# Patient Record
Sex: Female | Born: 1935 | Race: White | Hispanic: No | Marital: Married | Smoking: Never smoker
Health system: Southern US, Community
[De-identification: ages and names within clinical notes are randomized; demographics above are authoritative.]

## PROBLEM LIST (undated history)

## (undated) DIAGNOSIS — I2510 Atherosclerotic heart disease of native coronary artery without angina pectoris: Secondary | ICD-10-CM

## (undated) DIAGNOSIS — E7800 Pure hypercholesterolemia, unspecified: Secondary | ICD-10-CM

## (undated) DIAGNOSIS — I739 Peripheral vascular disease, unspecified: Secondary | ICD-10-CM

## (undated) DIAGNOSIS — R0600 Dyspnea, unspecified: Secondary | ICD-10-CM

## (undated) DIAGNOSIS — H9190 Unspecified hearing loss, unspecified ear: Secondary | ICD-10-CM

## (undated) DIAGNOSIS — I10 Essential (primary) hypertension: Secondary | ICD-10-CM

## (undated) HISTORY — PX: FOOT SURGERY: SHX648

## (undated) HISTORY — PX: CORONARY ANGIOPLASTY: SHX604

## (undated) HISTORY — PX: OTHER SURGICAL HISTORY: SHX169

---

## 2004-09-22 ENCOUNTER — Inpatient Hospital Stay: Payer: Self-pay | Admitting: Cardiology

## 2004-09-22 ENCOUNTER — Other Ambulatory Visit: Payer: Self-pay

## 2005-03-08 ENCOUNTER — Ambulatory Visit: Payer: Self-pay | Admitting: Internal Medicine

## 2006-05-02 ENCOUNTER — Ambulatory Visit: Payer: Self-pay | Admitting: Internal Medicine

## 2007-07-04 ENCOUNTER — Ambulatory Visit: Payer: Self-pay | Admitting: Internal Medicine

## 2008-07-01 ENCOUNTER — Ambulatory Visit: Payer: Self-pay | Admitting: Internal Medicine

## 2008-07-02 ENCOUNTER — Observation Stay: Payer: Self-pay | Admitting: Internal Medicine

## 2008-08-27 ENCOUNTER — Ambulatory Visit: Payer: Self-pay | Admitting: Internal Medicine

## 2009-08-30 ENCOUNTER — Ambulatory Visit: Payer: Self-pay | Admitting: Internal Medicine

## 2010-09-01 ENCOUNTER — Ambulatory Visit: Payer: Self-pay | Admitting: Internal Medicine

## 2011-09-05 ENCOUNTER — Ambulatory Visit: Payer: Self-pay | Admitting: Internal Medicine

## 2012-09-10 ENCOUNTER — Ambulatory Visit: Payer: Self-pay | Admitting: Internal Medicine

## 2013-11-18 ENCOUNTER — Ambulatory Visit: Payer: Self-pay | Admitting: Internal Medicine

## 2015-03-22 ENCOUNTER — Other Ambulatory Visit: Payer: Self-pay | Admitting: Internal Medicine

## 2015-03-22 DIAGNOSIS — Z1231 Encounter for screening mammogram for malignant neoplasm of breast: Secondary | ICD-10-CM

## 2015-04-07 ENCOUNTER — Ambulatory Visit
Admission: RE | Admit: 2015-04-07 | Discharge: 2015-04-07 | Disposition: A | Discharge status: Home / Self Care | Payer: PPO | Source: Ambulatory Visit | Attending: Internal Medicine | Admitting: Internal Medicine

## 2015-04-07 DIAGNOSIS — Z1231 Encounter for screening mammogram for malignant neoplasm of breast: Secondary | ICD-10-CM | POA: Insufficient documentation

## 2015-11-10 ENCOUNTER — Encounter: Payer: Self-pay | Admitting: *Deleted

## 2015-11-16 ENCOUNTER — Ambulatory Visit: Payer: PPO | Admitting: Anesthesiology

## 2015-11-16 ENCOUNTER — Encounter: Payer: Self-pay | Admitting: *Deleted

## 2015-11-16 ENCOUNTER — Ambulatory Visit
Admission: RE | Admit: 2015-11-16 | Discharge: 2015-11-16 | Disposition: A | Discharge status: Home / Self Care | Payer: PPO | Source: Ambulatory Visit | Attending: Ophthalmology | Admitting: Ophthalmology

## 2015-11-16 ENCOUNTER — Encounter
Admission: RE | Disposition: A | Discharge status: Home / Self Care | Payer: Self-pay | Source: Ambulatory Visit | Attending: Ophthalmology

## 2015-11-16 DIAGNOSIS — H2511 Age-related nuclear cataract, right eye: Secondary | ICD-10-CM | POA: Diagnosis present

## 2015-11-16 DIAGNOSIS — E7800 Pure hypercholesterolemia, unspecified: Secondary | ICD-10-CM | POA: Insufficient documentation

## 2015-11-16 DIAGNOSIS — Z955 Presence of coronary angioplasty implant and graft: Secondary | ICD-10-CM | POA: Diagnosis not present

## 2015-11-16 DIAGNOSIS — H9190 Unspecified hearing loss, unspecified ear: Secondary | ICD-10-CM | POA: Diagnosis not present

## 2015-11-16 DIAGNOSIS — I10 Essential (primary) hypertension: Secondary | ICD-10-CM | POA: Diagnosis not present

## 2015-11-16 DIAGNOSIS — I2510 Atherosclerotic heart disease of native coronary artery without angina pectoris: Secondary | ICD-10-CM | POA: Diagnosis not present

## 2015-11-16 HISTORY — PX: CATARACT EXTRACTION W/PHACO: SHX586

## 2015-11-16 HISTORY — DX: Atherosclerotic heart disease of native coronary artery without angina pectoris: I25.10

## 2015-11-16 HISTORY — DX: Essential (primary) hypertension: I10

## 2015-11-16 HISTORY — DX: Unspecified hearing loss, unspecified ear: H91.90

## 2015-11-16 SURGERY — PHACOEMULSIFICATION, CATARACT, WITH IOL INSERTION
Anesthesia: Monitor Anesthesia Care | Site: Eye | Laterality: Right | Wound class: Clean

## 2015-11-16 MED ORDER — EPINEPHRINE HCL 1 MG/ML IJ SOLN
INTRAMUSCULAR | Status: DC | PRN
  Administered 2015-11-16: 09:00:00 via OPHTHALMIC

## 2015-11-16 MED ORDER — EPINEPHRINE HCL 1 MG/ML IJ SOLN
INTRAMUSCULAR | Status: AC
  Filled 2015-11-16: qty 1

## 2015-11-16 MED ORDER — SODIUM CHLORIDE 0.9 % IV SOLN
INTRAVENOUS | Status: DC
  Administered 2015-11-16 (×2): via INTRAVENOUS

## 2015-11-16 MED ORDER — CEFUROXIME OPHTHALMIC INJECTION 1 MG/0.1 ML
INJECTION | OPHTHALMIC | Status: AC
  Filled 2015-11-16: qty 0.1

## 2015-11-16 MED ORDER — CEFUROXIME OPHTHALMIC INJECTION 1 MG/0.1 ML
INJECTION | OPHTHALMIC | Status: DC | PRN
  Administered 2015-11-16: 0.1 mL via INTRACAMERAL

## 2015-11-16 MED ORDER — MIDAZOLAM HCL 2 MG/2ML IJ SOLN
INTRAMUSCULAR | Status: DC | PRN
  Administered 2015-11-16: 1 mg via INTRAVENOUS

## 2015-11-16 MED ORDER — MOXIFLOXACIN HCL 0.5 % OP SOLN: 1.0000 [drp] | OPHTHALMIC | Status: DC | PRN

## 2015-11-16 MED ORDER — ARMC OPHTHALMIC DILATING GEL
1.0000 "application " | OPHTHALMIC | Status: DC | PRN
  Administered 2015-11-16: 1 via OPHTHALMIC

## 2015-11-16 MED ORDER — MOXIFLOXACIN HCL 0.5 % OP SOLN
OPHTHALMIC | Status: DC | PRN
  Administered 2015-11-16: 1 [drp] via OPHTHALMIC

## 2015-11-16 MED ORDER — TETRACAINE HCL 0.5 % OP SOLN
1.0000 [drp] | Freq: Once | OPHTHALMIC | Status: AC
  Administered 2015-11-16: 1 [drp] via OPHTHALMIC

## 2015-11-16 MED ORDER — NA CHONDROIT SULF-NA HYALURON 40-17 MG/ML IO SOLN
INTRAOCULAR | Status: DC | PRN
  Administered 2015-11-16: 1 mL via INTRAOCULAR

## 2015-11-16 MED ORDER — NA CHONDROIT SULF-NA HYALURON 40-17 MG/ML IO SOLN
INTRAOCULAR | Status: AC
  Filled 2015-11-16: qty 1

## 2015-11-16 MED ORDER — CARBACHOL 0.01 % IO SOLN
INTRAOCULAR | Status: DC | PRN
  Administered 2015-11-16: 0.5 mL via INTRAOCULAR

## 2015-11-16 MED ORDER — POVIDONE-IODINE 5 % OP SOLN
1.0000 "application " | Freq: Once | OPHTHALMIC | Status: AC
  Administered 2015-11-16: 1 via OPHTHALMIC

## 2015-11-16 SURGICAL SUPPLY — 21 items
CANNULA ANT/CHMB 27GA (MISCELLANEOUS) ×3 IMPLANT
CUP MEDICINE 2OZ PLAST GRAD ST (MISCELLANEOUS) ×3 IMPLANT
GLOVE BIO SURGEON STRL SZ8 (GLOVE) ×3 IMPLANT
GLOVE BIOGEL M 6.5 STRL (GLOVE) ×3 IMPLANT
GLOVE SURG LX 8.0 MICRO (GLOVE) ×2
GLOVE SURG LX STRL 8.0 MICRO (GLOVE) ×1 IMPLANT
GOWN STRL REUS W/ TWL LRG LVL3 (GOWN DISPOSABLE) ×2 IMPLANT
GOWN STRL REUS W/TWL LRG LVL3 (GOWN DISPOSABLE) ×4
LENS IOL TECNIS ITEC 24.0 (Intraocular Lens) ×3 IMPLANT
PACK CATARACT (MISCELLANEOUS) ×3 IMPLANT
PACK CATARACT BRASINGTON LX (MISCELLANEOUS) ×3 IMPLANT
PACK EYE AFTER SURG (MISCELLANEOUS) ×3 IMPLANT
SOL BSS BAG (MISCELLANEOUS) ×3
SOL PREP PVP 2OZ (MISCELLANEOUS) ×3
SOLUTION BSS BAG (MISCELLANEOUS) ×1 IMPLANT
SOLUTION PREP PVP 2OZ (MISCELLANEOUS) ×1 IMPLANT
SYR 3ML LL SCALE MARK (SYRINGE) ×3 IMPLANT
SYR 5ML LL (SYRINGE) ×3 IMPLANT
SYR TB 1ML 27GX1/2 LL (SYRINGE) ×3 IMPLANT
WATER STERILE IRR 1000ML POUR (IV SOLUTION) ×3 IMPLANT
WIPE NON LINTING 3.25X3.25 (MISCELLANEOUS) ×3 IMPLANT

## 2015-11-16 NOTE — Discharge Instructions (Signed)
Eye Surgery Discharge Instructions  Expect mild scratchy sensation or mild soreness. DO NOT RUB YOUR EYE!  The day of surgery:  Minimal physical activity, but bed rest is not required  No reading, computer work, or close hand work  No bending, lifting, or straining.  May watch TV  For 24 hours:  No driving, legal decisions, or alcoholic beverages  Safety precautions  Eat anything you prefer: It is better to start with liquids, then soup then solid foods.  _____ Eye patch should be worn until postoperative exam tomorrow.  ____ Solar shield eyeglasses should be worn for comfort in the sunlight/patch while sleeping  Resume all regular medications including aspirin or Coumadin if these were discontinued prior to surgery. You may shower, bathe, shave, or wash your hair. Tylenol may be taken for mild discomfort.  Call your doctor if you experience significant pain, nausea, or vomiting, fever > 101 or other signs of infection. [phone] or [phone] Specific instructions:  Follow-up Information    Follow up with Carlena BjornstadPORFILIO,WILLIAM LOUIS, MD On 11/17/2015.   Specialty:  Ophthalmology   Why:  11:00   Contact information:   [address] [place] [address] [phone]

## 2015-11-16 NOTE — Anesthesia Preprocedure Evaluation (Signed)
Anesthesia Evaluation  Patient identified by MRN, date of birth, ID band Patient awake    Reviewed: Allergy & Precautions, NPO status , Patient's Chart, lab work & pertinent test results, reviewed documented beta blocker date and time   Airway Mallampati: II  TM Distance: >3 FB     Dental  (+) Upper Dentures, Partial Lower   Pulmonary neg pulmonary ROS,    Pulmonary exam normal        Cardiovascular hypertension, Pt. on medications and Pt. on home beta blockers + CAD  Normal cardiovascular exam     Neuro/Psych Hard of Hearing negative psych ROS   GI/Hepatic negative GI ROS, Neg liver ROS,   Endo/Other  negative endocrine ROS  Renal/GU negative Renal ROS  negative genitourinary   Musculoskeletal negative musculoskeletal ROS (+)   Abdominal Normal abdominal exam  (+)   Peds negative pediatric ROS (+)  Hematology negative hematology ROS (+)   Anesthesia Other Findings   Reproductive/Obstetrics                             Anesthesia Physical Anesthesia Plan  ASA: III  Anesthesia Plan: MAC   Post-op Pain Management:    Induction: Intravenous  Airway Management Planned: Nasal Cannula  Additional Equipment:   Intra-op Plan:   Post-operative Plan:   Informed Consent: I have reviewed the patients History and Physical, chart, labs and discussed the procedure including the risks, benefits and alternatives for the proposed anesthesia with the patient or authorized representative who has indicated his/her understanding and acceptance.   Dental advisory given  Plan Discussed with: CRNA and Surgeon  Anesthesia Plan Comments:         Anesthesia Quick Evaluation

## 2015-11-16 NOTE — Transfer of Care (Signed)
Immediate Anesthesia Transfer of Care Note  Patient: Jeanette PaganiniDorothy B Malone  Procedure(s) Performed: Procedure(s) with comments: CATARACT EXTRACTION PHACO AND INTRAOCULAR LENS PLACEMENT (IOC) (Right) - US 48.9 AP% 16.7 CDE 8.13 Fluid Pack Lot # [id number]  Patient Location: PACU  Anesthesia Type:MAC  Level of Consciousness: awake, alert  and oriented  Airway & Oxygen Therapy: Patient Spontanous Breathing  Post-op Assessment: Report given to RN  Post vital signs: Reviewed and stable  Last Vitals:  Filed Vitals:   11/16/15 0818  BP: 139/86  Pulse: 66  Temp: 36.7 C  Resp: 18    Last Pain: There were no vitals filed for this visit.       Complications: No apparent anesthesia complications

## 2015-11-16 NOTE — H&P (Signed)
  All labs reviewed. Abnormal studies sent to patients PCP when indicated.  Previous H&P reviewed, patient examined, there are NO CHANGES.  Jeanette Rodwell LOUIS7/11/20178:59 AM

## 2015-11-16 NOTE — Op Note (Signed)
PREOPERATIVE DIAGNOSIS:  Nuclear sclerotic cataract of the right eye.   POSTOPERATIVE DIAGNOSIS: NUCLEAR SCLEROTIC CATARACT RIGHT EYE   OPERATIVE PROCEDURE:  Procedure(s): CATARACT EXTRACTION PHACO AND INTRAOCULAR LENS PLACEMENT (IOC)   SURGEON:  Galen ManilaWilliam Malikai Gut, MD.   ANESTHESIA:  Anesthesiologist: Yves DillPaul Carroll, MD CRNA: Evelena Peateborah Ferrero-Conover, CRNA  1.      Managed anesthesia care. 2.      Topical tetracaine drops followed by 2% Xylocaine jelly applied in the preoperative holding area.   COMPLICATIONS:  None.   TECHNIQUE:   Stop and chop   DESCRIPTION OF PROCEDURE:  The patient was examined and consented in the preoperative holding area where the aforementioned topical anesthesia was applied to the right eye and then brought back to the Operating Room where the right eye was prepped and draped in the usual sterile ophthalmic fashion and a lid speculum was placed. A paracentesis was created with the side port blade and the anterior chamber was filled with viscoelastic. A near clear corneal incision was performed with the steel keratome. A continuous curvilinear capsulorrhexis was performed with a cystotome followed by the capsulorrhexis forceps. Hydrodissection and hydrodelineation were carried out with BSS on a blunt cannula. The lens was removed in a stop and chop  technique and the remaining cortical material was removed with the irrigation-aspiration handpiece. The capsular bag was inflated with viscoelastic and the Technis ZCB00  lens was placed in the capsular bag without complication. The remaining viscoelastic was removed from the eye with the irrigation-aspiration handpiece. The wounds were hydrated. The anterior chamber was flushed with Miostat and the eye was inflated to physiologic pressure. 0.1 mL of cefuroxime concentration 10 mg/mL was placed in the anterior chamber. The wounds were found to be water tight. The eye was dressed with Vigamox. The patient was given protective  glasses to wear throughout the day and a shield with which to sleep tonight. The patient was also given drops with which to begin a drop regimen today and will follow-up with me in one day.  Implant Name Type Inv. Item Serial No. Manufacturer Lot No. LRB No. Used  LENS IOL DIOP 24.0 - [id number] Intraocular Lens LENS IOL DIOP 24.0 [id number] AMO   Right 1   Procedure(s) with comments: CATARACT EXTRACTION PHACO AND INTRAOCULAR LENS PLACEMENT (IOC) (Right) - US 48.9 AP% 16.7 CDE 8.13 Fluid Pack Lot # [id number]  Electronically signed: Eyleen Rawlinson LOUIS 11/16/2015 9:26 AM

## 2015-11-17 NOTE — Anesthesia Postprocedure Evaluation (Signed)
Anesthesia Post Note  Patient: Jeanette Malone  Procedure(s) Performed: Procedure(s) (LRB): CATARACT EXTRACTION PHACO AND INTRAOCULAR LENS PLACEMENT (IOC) (Right)  Patient location during evaluation: PACU Anesthesia Type: MAC Level of consciousness: awake and alert and oriented Pain management: pain level controlled Vital Signs Assessment: post-procedure vital signs reviewed and stable Respiratory status: spontaneous breathing Cardiovascular status: blood pressure returned to baseline Anesthetic complications: no    Last Vitals:  Filed Vitals:   11/16/15 0929 11/16/15 0946  BP: 151/65 166/79  Pulse: 59 64  Temp: 36.6 C   Resp: 16 16    Last Pain:  Filed Vitals:   11/17/15 0817  PainSc: 0-No pain                 Bear Osten

## 2016-01-18 ENCOUNTER — Encounter: Payer: Self-pay | Admitting: *Deleted

## 2016-01-18 ENCOUNTER — Ambulatory Visit: Payer: PPO | Admitting: Anesthesiology

## 2016-01-18 ENCOUNTER — Ambulatory Visit
Admission: RE | Admit: 2016-01-18 | Discharge: 2016-01-18 | Disposition: A | Discharge status: Home / Self Care | Payer: PPO | Source: Ambulatory Visit | Attending: Ophthalmology | Admitting: Ophthalmology

## 2016-01-18 ENCOUNTER — Encounter
Admission: RE | Disposition: A | Discharge status: Home / Self Care | Payer: Self-pay | Source: Ambulatory Visit | Attending: Ophthalmology

## 2016-01-18 DIAGNOSIS — Z882 Allergy status to sulfonamides status: Secondary | ICD-10-CM | POA: Diagnosis not present

## 2016-01-18 DIAGNOSIS — E7800 Pure hypercholesterolemia, unspecified: Secondary | ICD-10-CM | POA: Insufficient documentation

## 2016-01-18 DIAGNOSIS — Z888 Allergy status to other drugs, medicaments and biological substances status: Secondary | ICD-10-CM | POA: Insufficient documentation

## 2016-01-18 DIAGNOSIS — Z885 Allergy status to narcotic agent status: Secondary | ICD-10-CM | POA: Diagnosis not present

## 2016-01-18 DIAGNOSIS — H2512 Age-related nuclear cataract, left eye: Secondary | ICD-10-CM | POA: Insufficient documentation

## 2016-01-18 DIAGNOSIS — H9190 Unspecified hearing loss, unspecified ear: Secondary | ICD-10-CM | POA: Diagnosis not present

## 2016-01-18 DIAGNOSIS — Z955 Presence of coronary angioplasty implant and graft: Secondary | ICD-10-CM | POA: Insufficient documentation

## 2016-01-18 DIAGNOSIS — I10 Essential (primary) hypertension: Secondary | ICD-10-CM | POA: Insufficient documentation

## 2016-01-18 HISTORY — PX: CATARACT EXTRACTION W/PHACO: SHX586

## 2016-01-18 SURGERY — PHACOEMULSIFICATION, CATARACT, WITH IOL INSERTION
Anesthesia: Monitor Anesthesia Care | Site: Eye | Laterality: Left | Wound class: Clean

## 2016-01-18 MED ORDER — EPINEPHRINE HCL 1 MG/ML IJ SOLN
INTRAOCULAR | Status: DC | PRN
  Administered 2016-01-18: 1 mL via OPHTHALMIC

## 2016-01-18 MED ORDER — ARMC OPHTHALMIC DILATING GEL
OPHTHALMIC | Status: AC
  Administered 2016-01-18: 07:00:00
  Filled 2016-01-18: qty 0.25

## 2016-01-18 MED ORDER — MOXIFLOXACIN HCL 0.5 % OP SOLN: 1.0000 [drp] | Freq: Once | OPHTHALMIC | Status: DC

## 2016-01-18 MED ORDER — MIDAZOLAM HCL 2 MG/2ML IJ SOLN
INTRAMUSCULAR | Status: DC | PRN
  Administered 2016-01-18 (×2): 0.5 mg via INTRAVENOUS

## 2016-01-18 MED ORDER — TETRACAINE HCL 0.5 % OP SOLN
OPHTHALMIC | Status: AC
  Filled 2016-01-18: qty 2

## 2016-01-18 MED ORDER — MOXIFLOXACIN HCL 0.5 % OP SOLN
OPHTHALMIC | Status: DC | PRN
  Administered 2016-01-18: 1 [drp] via OPHTHALMIC

## 2016-01-18 MED ORDER — CEFUROXIME OPHTHALMIC INJECTION 1 MG/0.1 ML
INJECTION | OPHTHALMIC | Status: DC | PRN
  Administered 2016-01-18: .1 mL via INTRACAMERAL

## 2016-01-18 MED ORDER — EPINEPHRINE HCL 1 MG/ML IJ SOLN
INTRAMUSCULAR | Status: AC
  Filled 2016-01-18: qty 1

## 2016-01-18 MED ORDER — CEFUROXIME OPHTHALMIC INJECTION 1 MG/0.1 ML
INJECTION | OPHTHALMIC | Status: AC
  Filled 2016-01-18: qty 0.1

## 2016-01-18 MED ORDER — MOXIFLOXACIN HCL 0.5 % OP SOLN
OPHTHALMIC | Status: AC
  Filled 2016-01-18: qty 3

## 2016-01-18 MED ORDER — NA CHONDROIT SULF-NA HYALURON 40-17 MG/ML IO SOLN
INTRAOCULAR | Status: DC | PRN
  Administered 2016-01-18: 1 mL via INTRAOCULAR

## 2016-01-18 MED ORDER — CARBACHOL 0.01 % IO SOLN
INTRAOCULAR | Status: DC | PRN
  Administered 2016-01-18: .5 mL via INTRAOCULAR

## 2016-01-18 MED ORDER — POVIDONE-IODINE 5 % OP SOLN
1.0000 "application " | Freq: Once | OPHTHALMIC | Status: AC
  Administered 2016-01-18: 1 via OPHTHALMIC

## 2016-01-18 MED ORDER — NA CHONDROIT SULF-NA HYALURON 40-17 MG/ML IO SOLN
INTRAOCULAR | Status: AC
  Filled 2016-01-18: qty 1

## 2016-01-18 MED ORDER — TETRACAINE HCL 0.5 % OP SOLN
1.0000 [drp] | Freq: Once | OPHTHALMIC | Status: AC
  Administered 2016-01-18: 1 [drp] via OPHTHALMIC

## 2016-01-18 MED ORDER — ARMC OPHTHALMIC DILATING GEL
1.0000 "application " | OPHTHALMIC | Status: AC
  Administered 2016-01-18: 1 via OPHTHALMIC

## 2016-01-18 MED ORDER — SODIUM CHLORIDE 0.9 % IV SOLN
INTRAVENOUS | Status: DC
  Administered 2016-01-18: 07:00:00 via INTRAVENOUS

## 2016-01-18 MED ORDER — POVIDONE-IODINE 5 % OP SOLN
OPHTHALMIC | Status: AC
  Filled 2016-01-18: qty 30

## 2016-01-18 SURGICAL SUPPLY — 21 items
CANNULA ANT/CHMB 27GA (MISCELLANEOUS) ×3 IMPLANT
CUP MEDICINE 2OZ PLAST GRAD ST (MISCELLANEOUS) ×3 IMPLANT
GLOVE BIO SURGEON STRL SZ8 (GLOVE) ×3 IMPLANT
GLOVE BIOGEL M 6.5 STRL (GLOVE) ×3 IMPLANT
GLOVE SURG LX 8.0 MICRO (GLOVE) ×2
GLOVE SURG LX STRL 8.0 MICRO (GLOVE) ×1 IMPLANT
GOWN STRL REUS W/ TWL LRG LVL3 (GOWN DISPOSABLE) ×2 IMPLANT
GOWN STRL REUS W/TWL LRG LVL3 (GOWN DISPOSABLE) ×4
LENS IOL TECNIS ITEC 23.5 (Intraocular Lens) ×3 IMPLANT
PACK CATARACT (MISCELLANEOUS) ×3 IMPLANT
PACK CATARACT BRASINGTON LX (MISCELLANEOUS) ×3 IMPLANT
PACK EYE AFTER SURG (MISCELLANEOUS) ×3 IMPLANT
SOL BSS BAG (MISCELLANEOUS) ×3
SOL PREP PVP 2OZ (MISCELLANEOUS) ×3
SOLUTION BSS BAG (MISCELLANEOUS) ×1 IMPLANT
SOLUTION PREP PVP 2OZ (MISCELLANEOUS) ×1 IMPLANT
SYR 3ML LL SCALE MARK (SYRINGE) ×3 IMPLANT
SYR 5ML LL (SYRINGE) ×3 IMPLANT
SYR TB 1ML 27GX1/2 LL (SYRINGE) ×3 IMPLANT
WATER STERILE IRR 250ML POUR (IV SOLUTION) ×3 IMPLANT
WIPE NON LINTING 3.25X3.25 (MISCELLANEOUS) ×3 IMPLANT

## 2016-01-18 NOTE — Anesthesia Preprocedure Evaluation (Signed)
Anesthesia Evaluation  Patient identified by MRN, date of birth, ID band Patient awake    Reviewed: Allergy & Precautions, NPO status , Patient's Chart, lab work & pertinent test results, reviewed documented beta blocker date and time   History of Anesthesia Complications Negative for: history of anesthetic complications  Airway Mallampati: II  TM Distance: >3 FB Neck ROM: limited    Dental  (+) Upper Dentures, Partial Lower   Pulmonary neg pulmonary ROS,    Pulmonary exam normal        Cardiovascular hypertension, Pt. on medications and Pt. on home beta blockers + CAD  Normal cardiovascular exam     Neuro/Psych Hard of Hearing negative psych ROS   GI/Hepatic negative GI ROS, Neg liver ROS,   Endo/Other  negative endocrine ROS  Renal/GU negative Renal ROS  negative genitourinary   Musculoskeletal negative musculoskeletal ROS (+)   Abdominal Normal abdominal exam  (+)   Peds negative pediatric ROS (+)  Hematology negative hematology ROS (+)   Anesthesia Other Findings Past Medical History: No date: Coronary artery disease No date: HOH (hard of hearing)     Comment: AIDS No date: Hypertension   Reproductive/Obstetrics                             Anesthesia Physical  Anesthesia Plan  ASA: III  Anesthesia Plan: MAC   Post-op Pain Management:    Induction: Intravenous  Airway Management Planned: Nasal Cannula  Additional Equipment:   Intra-op Plan:   Post-operative Plan:   Informed Consent: I have reviewed the patients History and Physical, chart, labs and discussed the procedure including the risks, benefits and alternatives for the proposed anesthesia with the patient or authorized representative who has indicated his/her understanding and acceptance.   Dental advisory given  Plan Discussed with: CRNA and Surgeon  Anesthesia Plan Comments:         Anesthesia  Quick Evaluation

## 2016-01-18 NOTE — Discharge Instructions (Signed)
Eye Surgery Discharge Instructions  Expect mild scratchy sensation or mild soreness. DO NOT RUB YOUR EYE!  The day of surgery:  Minimal physical activity, but bed rest is not required  No reading, computer work, or close hand work  No bending, lifting, or straining.  May watch TV  For 24 hours:  No driving, legal decisions, or alcoholic beverages  Safety precautions  Eat anything you prefer: It is better to start with liquids, then soup then solid foods.  _____ Eye patch should be worn until postoperative exam tomorrow.  ____ Solar shield eyeglasses should be worn for comfort in the sunlight/patch while sleeping  Resume all regular medications including aspirin or Coumadin if these were discontinued prior to surgery. You may shower, bathe, shave, or wash your hair. Tylenol may be taken for mild discomfort.  Call your doctor if you experience significant pain, nausea, or vomiting, fever > 101 or other signs of infection. [phone] or [phone] Specific instructions:  Follow-up Information    PORFILIO,WILLIAM LOUIS, MD Follow up on 01/19/2016.   Specialty:  Ophthalmology Why:  10:50 am Contact information: [address] [place] [address] [phone]

## 2016-01-18 NOTE — Op Note (Signed)
PREOPERATIVE DIAGNOSIS:  Nuclear sclerotic cataract of the left eye.   POSTOPERATIVE DIAGNOSIS:  nuclear sclerotic cataract left eye   OPERATIVE PROCEDURE:  Procedure(s): CATARACT EXTRACTION PHACO AND INTRAOCULAR LENS PLACEMENT (IOC)   SURGEON:  Galen ManilaWilliam Derek Huneycutt, MD.   ANESTHESIA:   Anesthesiologist: Rosaria FerriesJoseph K Piscitello, MD CRNA: Ginger CarneStephanie Michelet, CRNA  1.      Managed anesthesia care. 2.      Topical tetracaine drops followed by 2% Xylocaine jelly applied in the preoperative holding area.   COMPLICATIONS:  None.   TECHNIQUE:   Stop and chop   DESCRIPTION OF PROCEDURE:  The patient was examined and consented in the preoperative holding area where the aforementioned topical anesthesia was applied to the left eye and then brought back to the Operating Room where the left eye was prepped and draped in the usual sterile ophthalmic fashion and a lid speculum was placed. A paracentesis was created with the side port blade and the anterior chamber was filled with viscoelastic. A near clear corneal incision was performed with the steel keratome. A continuous curvilinear capsulorrhexis was performed with a cystotome followed by the capsulorrhexis forceps. Hydrodissection and hydrodelineation were carried out with BSS on a blunt cannula. The lens was removed in a stop and chop  technique and the remaining cortical material was removed with the irrigation-aspiration handpiece. The capsular bag was inflated with viscoelastic and the Technis ZCB00 lens was placed in the capsular bag without complication. The remaining viscoelastic was removed from the eye with the irrigation-aspiration handpiece. The wounds were hydrated. The anterior chamber was flushed with Miostat and the eye was inflated to physiologic pressure. 0.1 mL of cefuroxime concentration 10 mg/mL was placed in the anterior chamber. The wounds were found to be water tight. The eye was dressed with Vigamox. The patient was given protective  glasses to wear throughout the day and a shield with which to sleep tonight. The patient was also given drops with which to begin a drop regimen today and will follow-up with me in one day.  Implant Name Type Inv. Item Serial No. Manufacturer Lot No. LRB No. Used  LENS IOL DIOP 23.5 - [id number] Intraocular Lens LENS IOL DIOP 23.5 [id number] AMO   Left 1   Procedure(s) with comments: CATARACT EXTRACTION PHACO AND INTRAOCULAR LENS PLACEMENT (IOC) (Left) - US 00:40 AP% 17.7 CDE 7.17 fluid pack lot # [id number]  Electronically signed: Dawt Reeb LOUIS 01/18/2016 8:29 AM

## 2016-01-18 NOTE — Anesthesia Procedure Notes (Signed)
Date/Time: 01/18/2016 8:13 AM Performed by: Ginger CarneMICHELET, Joakim Huesman Pre-anesthesia Checklist: Patient identified, Emergency Drugs available, Suction available, Patient being monitored and Timeout performed Patient Re-evaluated:Patient Re-evaluated prior to inductionOxygen Delivery Method: Nasal cannula

## 2016-01-18 NOTE — H&P (Signed)
  All labs reviewed. Abnormal studies sent to patients PCP when indicated.  Previous H&P reviewed, patient examined, there are NO CHANGES.  Ademola Vert LOUIS9/12/20178:04 AM

## 2016-01-18 NOTE — Transfer of Care (Signed)
Immediate Anesthesia Transfer of Care Note  Patient: Jeanette PaganiniDorothy B Malone  Procedure(s) Performed: Procedure(s) with comments: CATARACT EXTRACTION PHACO AND INTRAOCULAR LENS PLACEMENT (IOC) (Left) - US 00:40 AP% 17.7 CDE 7.17 fluid pack lot # [id number]  Patient Location: PACU  Anesthesia Type:MAC  Level of Consciousness: awake, alert  and oriented  Airway & Oxygen Therapy: Patient Spontanous Breathing  Post-op Assessment: Report given to RN and Post -op Vital signs reviewed and stable  Post vital signs: Reviewed and stable  Last Vitals:  Vitals:   01/18/16 0656 01/18/16 0831  BP: (!) 149/85 (!) 155/71  Pulse: (!) 56 (!) 52  Resp: 16 13  Temp: 36.6 C     Last Pain:  Vitals:   01/18/16 0656  TempSrc: Oral         Complications: No apparent anesthesia complications

## 2016-01-18 NOTE — Anesthesia Postprocedure Evaluation (Signed)
Anesthesia Post Note  Patient: Jeanette Malone  Procedure(s) Performed: Procedure(s) (LRB): CATARACT EXTRACTION PHACO AND INTRAOCULAR LENS PLACEMENT (IOC) (Left)  Patient location during evaluation: PACU Anesthesia Type: MAC Level of consciousness: awake, awake and alert and oriented Pain management: pain level controlled Vital Signs Assessment: post-procedure vital signs reviewed and stable Respiratory status: spontaneous breathing, nonlabored ventilation and respiratory function stable Cardiovascular status: stable Anesthetic complications: no    Last Vitals:  Vitals:   01/18/16 0831 01/18/16 0832  BP: (!) 155/71 (!) 155/71  Pulse: (!) 52 (!) 54  Resp: 13 14  Temp:  36.6 C    Last Pain:  Vitals:   01/18/16 0832  TempSrc: Oral                 Microsofthuy [name]

## 2016-01-18 NOTE — Anesthesia Postprocedure Evaluation (Signed)
Anesthesia Post Note  Patient: Jeanette PaganiniDorothy B Malone  Procedure(s) Performed: Procedure(s) (LRB): CATARACT EXTRACTION PHACO AND INTRAOCULAR LENS PLACEMENT (IOC) (Left)  Patient location during evaluation: PACU Anesthesia Type: MAC Level of consciousness: awake and alert Pain management: pain level controlled Vital Signs Assessment: post-procedure vital signs reviewed and stable Respiratory status: spontaneous breathing, nonlabored ventilation, respiratory function stable and patient connected to nasal cannula oxygen Cardiovascular status: stable and blood pressure returned to baseline Anesthetic complications: no    Last Vitals:  Vitals:   01/18/16 0831 01/18/16 0832  BP: (!) 155/71 (!) 155/71  Pulse: (!) 52 (!) 54  Resp: 13 14  Temp:  36.6 C    Last Pain:  Vitals:   01/18/16 0832  TempSrc: Oral                 Cleda MccreedyJoseph K Spiro Ausborn

## 2017-03-26 DIAGNOSIS — I209 Angina pectoris, unspecified: Secondary | ICD-10-CM

## 2017-03-27 ENCOUNTER — Ambulatory Visit
Admission: RE | Admit: 2017-03-27 | Discharge: 2017-03-28 | Disposition: A | Discharge status: Home / Self Care | Payer: Medicare HMO | Source: Ambulatory Visit | Attending: Internal Medicine | Admitting: Internal Medicine

## 2017-03-27 ENCOUNTER — Encounter
Admission: RE | Disposition: A | Discharge status: Home / Self Care | Payer: Self-pay | Source: Ambulatory Visit | Attending: Internal Medicine

## 2017-03-27 ENCOUNTER — Other Ambulatory Visit: Payer: Self-pay

## 2017-03-27 ENCOUNTER — Encounter: Payer: Self-pay | Admitting: *Deleted

## 2017-03-27 DIAGNOSIS — I10 Essential (primary) hypertension: Secondary | ICD-10-CM | POA: Diagnosis not present

## 2017-03-27 DIAGNOSIS — Z881 Allergy status to other antibiotic agents status: Secondary | ICD-10-CM | POA: Insufficient documentation

## 2017-03-27 DIAGNOSIS — I25119 Atherosclerotic heart disease of native coronary artery with unspecified angina pectoris: Secondary | ICD-10-CM | POA: Insufficient documentation

## 2017-03-27 DIAGNOSIS — Z885 Allergy status to narcotic agent status: Secondary | ICD-10-CM | POA: Diagnosis not present

## 2017-03-27 DIAGNOSIS — E7800 Pure hypercholesterolemia, unspecified: Secondary | ICD-10-CM | POA: Insufficient documentation

## 2017-03-27 DIAGNOSIS — Z882 Allergy status to sulfonamides status: Secondary | ICD-10-CM | POA: Insufficient documentation

## 2017-03-27 DIAGNOSIS — Z79899 Other long term (current) drug therapy: Secondary | ICD-10-CM | POA: Diagnosis not present

## 2017-03-27 DIAGNOSIS — Z7982 Long term (current) use of aspirin: Secondary | ICD-10-CM | POA: Diagnosis not present

## 2017-03-27 DIAGNOSIS — R079 Chest pain, unspecified: Secondary | ICD-10-CM

## 2017-03-27 DIAGNOSIS — I209 Angina pectoris, unspecified: Secondary | ICD-10-CM

## 2017-03-27 DIAGNOSIS — Z955 Presence of coronary angioplasty implant and graft: Secondary | ICD-10-CM | POA: Insufficient documentation

## 2017-03-27 HISTORY — DX: Peripheral vascular disease, unspecified: I73.9

## 2017-03-27 HISTORY — DX: Pure hypercholesterolemia, unspecified: E78.00

## 2017-03-27 HISTORY — DX: Dyspnea, unspecified: R06.00

## 2017-03-27 HISTORY — PX: LEFT HEART CATH AND CORONARY ANGIOGRAPHY: CATH118249

## 2017-03-27 HISTORY — PX: CORONARY STENT INTERVENTION: CATH118234

## 2017-03-27 LAB — CBC
HEMATOCRIT: 37.8 % (ref 35.0–47.0)
HEMOGLOBIN: 12.9 g/dL (ref 12.0–16.0)
MCH: 31.6 pg (ref 26.0–34.0)
MCHC: 34.2 g/dL (ref 32.0–36.0)
MCV: 92.3 fL (ref 80.0–100.0)
Platelets: 133 10*3/uL — ABNORMAL LOW (ref 150–440)
RBC: 4.09 MIL/uL (ref 3.80–5.20)
RDW: 14.3 % (ref 11.5–14.5)
WBC: 3.8 10*3/uL (ref 3.6–11.0)

## 2017-03-27 LAB — CREATININE, SERUM
Creatinine, Ser: 0.65 mg/dL (ref 0.44–1.00)
GFR calc Af Amer: >60 mL/min (ref >60)
GFR calc non Af Amer: >60 mL/min (ref >60)

## 2017-03-27 SURGERY — LEFT HEART CATH AND CORONARY ANGIOGRAPHY
Anesthesia: Moderate Sedation

## 2017-03-27 MED ORDER — SODIUM CHLORIDE 0.9 % IV SOLN
INTRAVENOUS | Status: AC
  Administered 2017-03-27: 09:00:00 via INTRAVENOUS

## 2017-03-27 MED ORDER — MIDAZOLAM HCL 2 MG/2ML IJ SOLN
INTRAMUSCULAR | Status: DC | PRN
  Administered 2017-03-27: 1 mg via INTRAVENOUS

## 2017-03-27 MED ORDER — HYDRALAZINE HCL 20 MG/ML IJ SOLN: 5.0000 mg | INTRAMUSCULAR | Status: AC | PRN

## 2017-03-27 MED ORDER — SODIUM CHLORIDE 0.9 % IV SOLN: 250.0000 mL | INTRAVENOUS | Status: DC | PRN

## 2017-03-27 MED ORDER — ASPIRIN 81 MG PO CHEW
CHEWABLE_TABLET | ORAL | Status: DC | PRN
  Administered 2017-03-27: 324 mg via ORAL

## 2017-03-27 MED ORDER — FENTANYL CITRATE (PF) 100 MCG/2ML IJ SOLN
INTRAMUSCULAR | Status: AC
  Filled 2017-03-27: qty 2

## 2017-03-27 MED ORDER — METOPROLOL SUCCINATE 12.5 MG HALF TABLET
12.5000 mg | ORAL_TABLET | Freq: Every day | ORAL | Status: DC
  Administered 2017-03-28: 12.5 mg via ORAL
  Filled 2017-03-27: qty 1

## 2017-03-27 MED ORDER — ASPIRIN 81 MG PO CHEW
81.0000 mg | CHEWABLE_TABLET | Freq: Every day | ORAL | Status: DC
  Administered 2017-03-28: 81 mg via ORAL
  Filled 2017-03-27: qty 1

## 2017-03-27 MED ORDER — ACETAMINOPHEN 325 MG PO TABS
650.0000 mg | ORAL_TABLET | ORAL | Status: DC | PRN
  Administered 2017-03-27: 650 mg via ORAL
  Filled 2017-03-27: qty 2

## 2017-03-27 MED ORDER — CLOPIDOGREL BISULFATE 75 MG PO TABS
ORAL_TABLET | ORAL | Status: DC | PRN
  Administered 2017-03-27: 300 mg via ORAL

## 2017-03-27 MED ORDER — ISOSORBIDE MONONITRATE ER 30 MG PO TB24
30.0000 mg | ORAL_TABLET | Freq: Every day | ORAL | Status: DC
  Administered 2017-03-28: 30 mg via ORAL
  Filled 2017-03-27: qty 1

## 2017-03-27 MED ORDER — ONDANSETRON HCL 4 MG/2ML IJ SOLN: 4.0000 mg | Freq: Four times a day (QID) | INTRAMUSCULAR | Status: DC | PRN

## 2017-03-27 MED ORDER — BIVALIRUDIN BOLUS VIA INFUSION - CUPID
INTRAVENOUS | Status: DC | PRN
  Administered 2017-03-27: 45.225 mg via INTRAVENOUS

## 2017-03-27 MED ORDER — ASPIRIN 81 MG PO CHEW: 81.0000 mg | CHEWABLE_TABLET | ORAL | Status: DC

## 2017-03-27 MED ORDER — LABETALOL HCL 5 MG/ML IV SOLN: 10.0000 mg | INTRAVENOUS | Status: AC | PRN

## 2017-03-27 MED ORDER — SODIUM CHLORIDE 0.9% FLUSH
3.0000 mL | INTRAVENOUS | Status: DC | PRN
  Administered 2017-03-27: 3 mL via INTRAVENOUS
  Filled 2017-03-27: qty 3

## 2017-03-27 MED ORDER — IOPAMIDOL (ISOVUE-300) INJECTION 61%
INTRAVENOUS | Status: DC | PRN
  Administered 2017-03-27: 120 mL via INTRA_ARTERIAL

## 2017-03-27 MED ORDER — SODIUM CHLORIDE 0.9% FLUSH
3.0000 mL | Freq: Two times a day (BID) | INTRAVENOUS | Status: DC
  Administered 2017-03-27 – 2017-03-28 (×3): 3 mL via INTRAVENOUS

## 2017-03-27 MED ORDER — SODIUM CHLORIDE 0.9 % WEIGHT BASED INFUSION
3.0000 mL/kg/h | INTRAVENOUS | Status: DC
  Administered 2017-03-27: 3 mL/kg/h via INTRAVENOUS

## 2017-03-27 MED ORDER — ASPIRIN 81 MG PO CHEW
CHEWABLE_TABLET | ORAL | Status: AC
  Filled 2017-03-27: qty 4

## 2017-03-27 MED ORDER — FENTANYL CITRATE (PF) 100 MCG/2ML IJ SOLN
INTRAMUSCULAR | Status: DC | PRN
  Administered 2017-03-27: 25 ug via INTRAVENOUS

## 2017-03-27 MED ORDER — SODIUM CHLORIDE 0.9% FLUSH: 3.0000 mL | Freq: Two times a day (BID) | INTRAVENOUS | Status: DC

## 2017-03-27 MED ORDER — MIDAZOLAM HCL 2 MG/2ML IJ SOLN
INTRAMUSCULAR | Status: AC
  Filled 2017-03-27: qty 2

## 2017-03-27 MED ORDER — SODIUM CHLORIDE 0.9% FLUSH: 3.0000 mL | INTRAVENOUS | Status: DC | PRN

## 2017-03-27 MED ORDER — NITROGLYCERIN 5 MG/ML IV SOLN
INTRAVENOUS | Status: AC
  Filled 2017-03-27: qty 10

## 2017-03-27 MED ORDER — SODIUM CHLORIDE 0.9 % IV SOLN
INTRAVENOUS | Status: DC | PRN
  Administered 2017-03-27: 1.75 mg/kg/h via INTRAVENOUS

## 2017-03-27 MED ORDER — CLOPIDOGREL BISULFATE 75 MG PO TABS
75.0000 mg | ORAL_TABLET | Freq: Every day | ORAL | Status: DC
  Administered 2017-03-28: 75 mg via ORAL
  Filled 2017-03-27: qty 1

## 2017-03-27 MED ORDER — HEPARIN SODIUM (PORCINE) 5000 UNIT/ML IJ SOLN
5000.0000 [IU] | Freq: Three times a day (TID) | INTRAMUSCULAR | Status: DC
  Administered 2017-03-28: 5000 [IU] via SUBCUTANEOUS
  Filled 2017-03-27: qty 1

## 2017-03-27 MED ORDER — BIVALIRUDIN TRIFLUOROACETATE 250 MG IV SOLR
INTRAVENOUS | Status: AC
  Filled 2017-03-27: qty 250

## 2017-03-27 MED ORDER — CLOPIDOGREL BISULFATE 75 MG PO TABS
ORAL_TABLET | ORAL | Status: AC
  Filled 2017-03-27: qty 8

## 2017-03-27 MED ORDER — LIDOCAINE HCL (PF) 1 % IJ SOLN
INTRAMUSCULAR | Status: AC
  Filled 2017-03-27: qty 30

## 2017-03-27 MED ORDER — PANTOPRAZOLE SODIUM 40 MG PO TBEC
40.0000 mg | DELAYED_RELEASE_TABLET | Freq: Every day | ORAL | Status: DC
  Administered 2017-03-28: 40 mg via ORAL
  Filled 2017-03-27: qty 1

## 2017-03-27 MED ORDER — IOPAMIDOL (ISOVUE-300) INJECTION 61%
INTRAVENOUS | Status: DC | PRN
  Administered 2017-03-27: 45 mL via INTRA_ARTERIAL

## 2017-03-27 MED ORDER — NITROGLYCERIN 1 MG/10 ML FOR IR/CATH LAB
INTRA_ARTERIAL | Status: DC | PRN
  Administered 2017-03-27 (×3): 200 ug via INTRACORONARY

## 2017-03-27 MED ORDER — SODIUM CHLORIDE 0.9 % WEIGHT BASED INFUSION: 1.0000 mL/kg/h | INTRAVENOUS | Status: DC

## 2017-03-27 MED ORDER — NON FORMULARY: 40.0000 mg | Freq: Every day | Status: DC

## 2017-03-27 SURGICAL SUPPLY — 19 items
BALLN TREK RX 2.25X12 (BALLOONS) ×3
BALLN ~~LOC~~ TREK RX 2.5X12 (BALLOONS) ×3
BALLOON TREK RX 2.25X12 (BALLOONS) ×1 IMPLANT
BALLOON ~~LOC~~ TREK RX 2.5X12 (BALLOONS) ×1 IMPLANT
CATH 5FR PIGTAIL DIAGNOSTIC (CATHETERS) ×3 IMPLANT
CATH INFINITI 5 FR 3DRC (CATHETERS) ×3 IMPLANT
CATH INFINITI 5FR JL4 (CATHETERS) ×3 IMPLANT
CATH INFINITI JR4 5F (CATHETERS) ×3 IMPLANT
CATH VISTA GUIDE 6FR JR4 (CATHETERS) ×3 IMPLANT
DEVICE CLOSURE MYNXGRIP 6/7F (Vascular Products) ×3 IMPLANT
DEVICE INFLAT 30 PLUS (MISCELLANEOUS) ×3 IMPLANT
KIT MANI 3VAL PERCEP (MISCELLANEOUS) ×3 IMPLANT
NEEDLE PERC 18GX7CM (NEEDLE) ×3 IMPLANT
PACK CARDIAC CATH (CUSTOM PROCEDURE TRAY) ×3 IMPLANT
SHEATH AVANTI 6FR X 11CM (SHEATH) ×3 IMPLANT
SHEATH PINNACLE 5F 10CM (SHEATH) ×3 IMPLANT
STENT SIERRA 2.50 X 18 MM (Permanent Stent) ×3 IMPLANT
WIRE EMERALD 3MM-J .035X150CM (WIRE) ×3 IMPLANT
WIRE RUNTHROUGH .014X180CM (WIRE) ×3 IMPLANT

## 2017-03-27 NOTE — Progress Notes (Signed)
Patient admitted to unit. Oriented to room, call bell, and staff. Bed in lowest position. Fall safety plan reviewed. Full assessment to Epic. Skin assessment verified with Rodney Boozeasha RN. Telemetry box verification with tele clerk and Mykia NT- Box#: [id number]. PCI today, site stable (level 0). Will continue to monitor.

## 2017-03-27 NOTE — Brief Op Note (Signed)
BRIEF CARDIAC CATHETERIZATION NOTE  DATE: 03/27/2017 TIME: 9:08 AM  PATIENT:  Jeanette Malone  82 y.o. female  PRE-OPERATIVE DIAGNOSIS:  Angina pectoris  POST-OPERATIVE DIAGNOSIS:  Same  PROCEDURE:  Procedure(s): LEFT HEART CATH AND CORONARY ANGIOGRAPHY (N/A) CORONARY STENT INTERVENTION (N/A) CORONARY STENT INTERVENTION (N/A)  SURGEON:  Surgeon(s) and Role: Panel 1:    * Lamar BlinksKowalski, Bruce J, MD - Primary Panel 2:    * Mimi Debellis, Cristal Deerhristopher, MD - Primary  FINDINGS: 1. See Dr. Philemon KingdomKowalski's diagnostic catheterization report. 2. Successful PCI to 99% mid RCA lesion with placement of Xience Sierra 2.5 x 18 mm drug-eluting stent with 0% residual stenosis and TIMI-3 flow.  RECOMMENDATIONS: 1. Dual antiplatelet therapy with aspirin and clopidogrel for at least 6 months. 2. Overnight extended recovery.  [name], MD [facility] Pager: [phone]

## 2017-03-28 DIAGNOSIS — I25119 Atherosclerotic heart disease of native coronary artery with unspecified angina pectoris: Secondary | ICD-10-CM | POA: Diagnosis not present

## 2017-03-28 LAB — BASIC METABOLIC PANEL
ANION GAP: 6 (ref 5–15)
BUN: 18 mg/dL (ref 6–20)
CO2: 26 mmol/L (ref 22–32)
Calcium: 8.9 mg/dL (ref 8.9–10.3)
Chloride: 111 mmol/L (ref 101–111)
Creatinine, Ser: 0.69 mg/dL (ref 0.44–1.00)
GFR calc Af Amer: >60 mL/min (ref >60)
GLUCOSE: 96 mg/dL (ref 65–99)
POTASSIUM: 3.8 mmol/L (ref 3.5–5.1)
Sodium: 143 mmol/L (ref 135–145)

## 2017-03-28 LAB — CBC
HEMATOCRIT: 39.6 % (ref 35.0–47.0)
HEMOGLOBIN: 13.3 g/dL (ref 12.0–16.0)
MCH: 31.3 pg (ref 26.0–34.0)
MCHC: 33.5 g/dL (ref 32.0–36.0)
MCV: 93.3 fL (ref 80.0–100.0)
Platelets: 126 10*3/uL — ABNORMAL LOW (ref 150–440)
RBC: 4.24 MIL/uL (ref 3.80–5.20)
RDW: 14.5 % (ref 11.5–14.5)
WBC: 3.9 10*3/uL (ref 3.6–11.0)

## 2017-03-28 LAB — POCT ACTIVATED CLOTTING TIME: Activated Clotting Time: 445 seconds

## 2017-03-28 NOTE — Plan of Care (Signed)
  Adequate for Discharge Education: Knowledge of General Education information will improve 03/28/2017 1012 - Adequate for Discharge by Erma HeritageAlejo Calderon, Terrie Grajales, RN Health Behavior/Discharge Planning: Ability to manage health-related needs will improve 03/28/2017 1012 - Adequate for Discharge by Erma HeritageAlejo Calderon, Shanaya Schneck, RN Clinical Measurements: Ability to maintain clinical measurements within normal limits will improve 03/28/2017 1012 - Adequate for Discharge by Weldon PickingAlejo Calderon, Manuella GhaziBerenice, RN Will remain free from infection 03/28/2017 1012 - Adequate for Discharge by Erma HeritageAlejo Calderon, Dasiah Hooley, RN Diagnostic test results will improve 03/28/2017 1012 - Adequate for Discharge by Erma HeritageAlejo Calderon, Mei Suits, RN Respiratory complications will improve 03/28/2017 1012 - Adequate for Discharge by Erma HeritageAlejo Calderon, Erandy Mceachern, RN Cardiovascular complication will be avoided 03/28/2017 1012 - Adequate for Discharge by Weldon PickingAlejo Calderon, Manuella GhaziBerenice, RN Activity: Risk for activity intolerance will decrease 03/28/2017 1012 - Adequate for Discharge by Erma HeritageAlejo Calderon, Galya Dunnigan, RN Nutrition: Adequate nutrition will be maintained 03/28/2017 1012 - Adequate for Discharge by Erma HeritageAlejo Calderon, Teofil Maniaci, RN Coping: Level of anxiety will decrease 03/28/2017 1012 - Adequate for Discharge by Weldon PickingAlejo Calderon, Manuella GhaziBerenice, RN Elimination: Will not experience complications related to bowel motility 03/28/2017 1012 - Adequate for Discharge by Weldon PickingAlejo Calderon, Manuella GhaziBerenice, RN Will not experience complications related to urinary retention 03/28/2017 1012 - Adequate for Discharge by Erma HeritageAlejo Calderon, Ezme Duch, RN Pain Managment: General experience of comfort will improve 03/28/2017 1012 - Adequate for Discharge by Erma HeritageAlejo Calderon, Daeshon Grammatico, RN Safety: Ability to remain free from injury will improve 03/28/2017 1012 - Adequate for Discharge by Weldon PickingAlejo Calderon, Manuella GhaziBerenice, RN Skin Integrity: Risk for impaired skin integrity will  decrease 03/28/2017 1012 - Adequate for Discharge by Erma HeritageAlejo Calderon, Leighanna Kirn, RN Education: Understanding of CV disease, CV risk reduction, and recovery process will improve 03/28/2017 1012 - Adequate for Discharge by Erma HeritageAlejo Calderon, Kamillah Didonato, RN Activity: Ability to return to baseline activity level will improve 03/28/2017 1012 - Adequate for Discharge by Erma HeritageAlejo Calderon, Masaichi Kracht, RN Cardiovascular: Ability to achieve and maintain adequate cardiovascular perfusion will improve 03/28/2017 1012 - Adequate for Discharge by Erma HeritageAlejo Calderon, Delmer Kowalski, RN Vascular access site(s) Level 0-1 will be maintained 03/28/2017 1012 - Adequate for Discharge by Erma HeritageAlejo Calderon, Evelean Bigler, RN Health Behavior/Discharge Planning: Ability to safely manage health-related needs after discharge will improve 03/28/2017 1012 - Adequate for Discharge by Erma HeritageAlejo Calderon, Shae Hinnenkamp, RN

## 2017-03-28 NOTE — Discharge Summary (Signed)
[facility]  Patient ID: Desma PaganiniDorothy B Bobo MRN: [id number] DOB/AGE: 08-19-1934 82 y.o.  Admit date: 03/27/2017 Discharge date: 03/28/2017  Primary Discharge Diagnosis: Coronary artery disease with angina I25.119 Secondary Discharge Diagnosis high blood pressure and high cholesterol  Significant Diagnostic Studies: Cardiac cath with left ventricular angiogram and selective coronary injection as well as PCI and stent placement of right coronary artery.  Hospital Course: The patient was admitted to specials for cardiac cath with selective coronary angiogram after full consent, risk and benefits explained, and time out called with all approprate details voiced and discussed. The patient has had progressive canadian class 4 angina  with coronary artery risk factors including high blood pressure and high cholesterol. The procedure was performed without complication and it revealed normal left ventricular function with ejection fraction of 60%.  It was found that the patient had severe 2 vessel coronary atherosclerosis with significant rca and lad stenosis requiring further intervention. Therefore, the patient had a PCI and drug eluding stent placed  In rca without complication with medical mgt of lad and consideration of pci if needed in future. The patient has been ambulating without further significant symptoms and has reached his maximal hospital benefit and will be discharged to home in good condition.  Cardiac rehabilitation has been discussed and recommended. Medication management of cardiovascular risk factors will be given post discharge and modified as an outpatient.   Discharge Exam: Blood pressure (!) 160/74, pulse 61, temperature 98.3 F (36.8 C), temperature source Oral, resp. rate 18, height 4\' 10"  (1.473 m), weight 60.3 kg (133 lb), SpO2 96 %.  Constitutional: Alet oriented to person, place, and time. No distress.  HENT: No nasal discharge.  Head:  Normocephalic and atraumatic.  Eyes: Pupils are equal and round. No discharge.  Neck: Normal range of motion. Neck supple. No JVD present. No thyromegaly present.  Cardiovascular: Normal rate, regular rhythm, normal S1 S2, no gallop, no friction rub. No murmur Pulmonary/Chest: Effort normal, No stridor. No respiratory distress. no wheezes.  no rales.    Abdominal: Soft. Bowel sounds are normal.  no distension.  no tenderness. There is no rebound and no guarding.  Musculoskeletal: No edema, no cyanosis, normal pulses, no bleeding, Normal range of motion. no tenderness.  Neurological:  alert and oriented to person, place, and time. Coordination normal.  Skin: Skin is warm and dry. No rash noted. No erythema. No pallor.  Psychiatric:  normal mood and affect. behavior is normal.    Labs:   Lab Results  Component Value Date   WBC 3.9 03/28/2017   HGB 13.3 03/28/2017   HCT 39.6 03/28/2017   MCV 93.3 03/28/2017   PLT 126 (L) 03/28/2017    Recent Labs  Lab 03/28/17 0315  NA 143  K 3.8  CL 111  CO2 26  BUN 18  CREATININE 0.69  CALCIUM 8.9  GLUCOSE 96    EKG: NSR without evidence of new changes  FOLLOW UP IN ONE TO TWO WEEKS  Allergies as of 03/28/2017      Reactions   Statins Other (See Comments)   Leg pain   Codeine Nausea And Vomiting   Fosamax [alendronate] Other (See Comments)   Pt unsure   Sulfa Antibiotics Hives   Celebrex [celecoxib] Rash      Medication List    TAKE these medications   aspirin 325 MG EC tablet Take 325 mg by mouth daily.   clopidogrel 75 MG tablet Commonly known as:  PLAVIX  Take 75 mg by mouth daily.   diclofenac sodium 1 % Gel Commonly known as:  VOLTAREN Apply 1 application 3 (three) times daily as needed topically (pain).   isosorbide mononitrate 30 MG 24 hr tablet Commonly known as:  IMDUR Take 30 mg daily by mouth.   lovastatin 40 MG tablet Commonly known as:  MEVACOR Take 1 tablet by mouth daily.   metoprolol succinate 25  MG 24 hr tablet Commonly known as:  TOPROL-XL Take 12.5 mg daily by mouth.   pantoprazole 40 MG tablet Commonly known as:  PROTONIX Take 40 mg daily by mouth.      Follow-up Information    Lamar BlinksKowalski, Bruce J, MD Follow up.   Specialty:  Cardiology Contact information: [address] [facility] [place] [address] [phone]           THE PATIENT  SHALL BRING ALL MEDICATIONS TO FOLLOW UP APPOINTMENT  Signed:  Lamar BlinksBruce J Kowalski MD, [facility] 03/28/2017, 7:48 AM

## 2017-03-28 NOTE — Progress Notes (Signed)
Pt given discharge instructions, IV out, tele off. Pt and family verbalized understanding with no further questions. Pt transported via wheelchair.

## 2017-03-28 NOTE — Plan of Care (Signed)
  Progressing Education: Knowledge of General Education information will improve 03/28/2017 0103 - Progressing by Dorna LeitzNesbitt, Shannara Winbush M, RN

## 2017-08-29 ENCOUNTER — Encounter: Payer: Self-pay | Admitting: Gerontology

## 2017-08-29 NOTE — Progress Notes (Signed)
Opened in error; Disregard.

## 2018-05-06 ENCOUNTER — Other Ambulatory Visit: Payer: Self-pay | Admitting: Adult Health

## 2018-07-19 NOTE — Progress Notes (Signed)
This encounter was created in error - please disregard.

## 2021-01-13 ENCOUNTER — Other Ambulatory Visit: Payer: Self-pay | Admitting: Sports Medicine

## 2021-01-13 ENCOUNTER — Other Ambulatory Visit (HOSPITAL_COMMUNITY): Payer: Self-pay | Admitting: Sports Medicine

## 2021-01-13 DIAGNOSIS — G8929 Other chronic pain: Secondary | ICD-10-CM

## 2021-01-13 DIAGNOSIS — M47816 Spondylosis without myelopathy or radiculopathy, lumbar region: Secondary | ICD-10-CM

## 2021-01-13 DIAGNOSIS — M5137 Other intervertebral disc degeneration, lumbosacral region: Secondary | ICD-10-CM

## 2021-01-13 DIAGNOSIS — M47818 Spondylosis without myelopathy or radiculopathy, sacral and sacrococcygeal region: Secondary | ICD-10-CM

## 2021-01-13 DIAGNOSIS — M4316 Spondylolisthesis, lumbar region: Secondary | ICD-10-CM

## 2021-01-26 ENCOUNTER — Other Ambulatory Visit: Payer: Self-pay

## 2021-01-26 ENCOUNTER — Ambulatory Visit
Admission: RE | Admit: 2021-01-26 | Discharge: 2021-01-26 | Disposition: A | Discharge status: Home / Self Care | Payer: Medicare HMO | Source: Ambulatory Visit | Attending: Sports Medicine | Admitting: Sports Medicine

## 2021-01-26 DIAGNOSIS — M4316 Spondylolisthesis, lumbar region: Secondary | ICD-10-CM | POA: Diagnosis present

## 2021-01-26 DIAGNOSIS — G8929 Other chronic pain: Secondary | ICD-10-CM | POA: Diagnosis present

## 2021-01-26 DIAGNOSIS — M47816 Spondylosis without myelopathy or radiculopathy, lumbar region: Secondary | ICD-10-CM | POA: Diagnosis present

## 2021-01-26 DIAGNOSIS — M5137 Other intervertebral disc degeneration, lumbosacral region: Secondary | ICD-10-CM | POA: Diagnosis present

## 2021-01-26 DIAGNOSIS — M5441 Lumbago with sciatica, right side: Secondary | ICD-10-CM | POA: Insufficient documentation

## 2021-01-26 DIAGNOSIS — M47818 Spondylosis without myelopathy or radiculopathy, sacral and sacrococcygeal region: Secondary | ICD-10-CM | POA: Insufficient documentation

## 2021-12-20 ENCOUNTER — Emergency Department
Admission: EM | Admit: 2021-12-20 | Discharge: 2021-12-20 | Disposition: A | Discharge status: Home / Self Care | Payer: Medicare HMO | Attending: Emergency Medicine | Admitting: Emergency Medicine

## 2021-12-20 ENCOUNTER — Other Ambulatory Visit: Payer: Self-pay

## 2021-12-20 DIAGNOSIS — I10 Essential (primary) hypertension: Secondary | ICD-10-CM | POA: Diagnosis not present

## 2021-12-20 DIAGNOSIS — N390 Urinary tract infection, site not specified: Secondary | ICD-10-CM

## 2021-12-20 DIAGNOSIS — I2510 Atherosclerotic heart disease of native coronary artery without angina pectoris: Secondary | ICD-10-CM | POA: Diagnosis not present

## 2021-12-20 DIAGNOSIS — R319 Hematuria, unspecified: Secondary | ICD-10-CM | POA: Diagnosis present

## 2021-12-20 LAB — COMPREHENSIVE METABOLIC PANEL
ALT: 12 U/L (ref 0–44)
AST: 19 U/L (ref 15–41)
Albumin: 4.3 g/dL (ref 3.5–5.0)
Alkaline Phosphatase: 67 U/L (ref 38–126)
Anion gap: 7 (ref 5–15)
BUN: 22 mg/dL (ref 8–23)
CO2: 29 mmol/L (ref 22–32)
Calcium: 10 mg/dL (ref 8.9–10.3)
Chloride: 108 mmol/L (ref 98–111)
Creatinine, Ser: 0.84 mg/dL (ref 0.44–1.00)
GFR, Estimated: >60 mL/min (ref >60)
Glucose, Bld: 123 mg/dL — ABNORMAL HIGH (ref 70–99)
Potassium: 3.8 mmol/L (ref 3.5–5.1)
Sodium: 144 mmol/L (ref 135–145)
Total Bilirubin: 0.5 mg/dL (ref 0.3–1.2)
Total Protein: 7.5 g/dL (ref 6.5–8.1)

## 2021-12-20 LAB — URINALYSIS, ROUTINE W REFLEX MICROSCOPIC
RBC / HPF: >50 RBC/hpf — ABNORMAL HIGH (ref 0–5)
Specific Gravity, Urine: 1.022 (ref 1.005–1.030)
Squamous Epithelial / LPF: NONE SEEN (ref 0–5)
WBC, UA: >50 WBC/hpf — ABNORMAL HIGH (ref 0–5)

## 2021-12-20 LAB — CBC
HCT: 41.5 % (ref 36.0–46.0)
Hemoglobin: 14.1 g/dL (ref 12.0–15.0)
MCH: 31.1 pg (ref 26.0–34.0)
MCHC: 34 g/dL (ref 30.0–36.0)
MCV: 91.6 fL (ref 80.0–100.0)
Platelets: 152 10*3/uL (ref 150–400)
RBC: 4.53 MIL/uL (ref 3.87–5.11)
RDW: 13.3 % (ref 11.5–15.5)
WBC: 8.2 10*3/uL (ref 4.0–10.5)
nRBC: 0 % (ref 0.0–0.2)

## 2021-12-20 MED ORDER — CEPHALEXIN 500 MG PO CAPS: 500.0000 mg | ORAL_CAPSULE | Freq: Four times a day (QID) | ORAL | 0 refills | Status: AC

## 2021-12-20 MED ORDER — CEPHALEXIN 500 MG PO CAPS
500.0000 mg | ORAL_CAPSULE | Freq: Once | ORAL | Status: AC
  Administered 2021-12-20: 500 mg via ORAL
  Filled 2021-12-20: qty 1

## 2021-12-20 NOTE — ED Triage Notes (Signed)
Pt in with co hematuria unsure if she has vaginal bleeding. Pt co feeling pressure when she void, no fever.

## 2021-12-20 NOTE — ED Provider Notes (Signed)
[facility] Provider Note    Event Date/Time   First MD Initiated Contact with Patient 12/20/21 1644     (approximate)   History   Chief Complaint Hematuria   HPI  Jeanette Malone is a 87 y.o. female with past medical history of hypertension, hyperlipidemia, CAD, and peripheral vascular disease who presents to the ED complaining of hematuria.  Patient reports that she woke up this morning feeling pressure in her suprapubic area.  Since then, she has had discomfort when she goes to urinate and has noticed some blood in her urine.  She denies any vaginal bleeding or discharge, has not noticed any blood in her stool.  She denies any fevers, flank pain, nausea, or vomiting.  She states that current symptoms feel similar to prior urinary tract infections.     Physical Exam   Triage Vital Signs: ED Triage Vitals [12/20/21 1537]  Enc Vitals Group     BP (!) 157/82     Pulse Rate 86     Resp 18     Temp 98.9 F (37.2 C)     Temp Source Oral     SpO2 98 %     Weight 140 lb (63.5 kg)     Height 4\' 10"  (1.473 m)     Head Circumference      Peak Flow      Pain Score 5     Pain Loc      Pain Edu?      Excl. in GC?     Most recent vital signs: Vitals:   12/20/21 1800 12/20/21 1830  BP: (!) 152/72 (!) 151/75  Pulse: 72 65  Resp: 15 16  Temp:    SpO2: 98% 96%    Constitutional: Alert and oriented. Eyes: Conjunctivae are normal. Head: Atraumatic. Nose: No congestion/rhinnorhea. Mouth/Throat: Mucous membranes are moist.  Cardiovascular: Normal rate, regular rhythm. Grossly normal heart sounds.  2+ radial pulses bilaterally. Respiratory: Normal respiratory effort.  No retractions. Lungs CTAB. Gastrointestinal: Soft and tender to palpation in the suprapubic area with no rebound or guarding.  No CVA tenderness bilaterally. No distention. Musculoskeletal: No lower extremity tenderness nor edema.  Neurologic:  Normal speech and language. No gross  focal neurologic deficits are appreciated.    ED Results / Procedures / Treatments   Labs (all labs ordered are listed, but only abnormal results are displayed) Labs Reviewed  COMPREHENSIVE METABOLIC PANEL - Abnormal; Notable for the following components:      Result Value   Glucose, Bld 123 (*)    All other components within normal limits  URINALYSIS, ROUTINE W REFLEX MICROSCOPIC - Abnormal; Notable for the following components:   Color, Urine RED (*)    APPearance TURBID (*)    Glucose, UA   (*)    Value: TEST NOT REPORTED DUE TO COLOR INTERFERENCE OF URINE PIGMENT   Hgb urine dipstick   (*)    Value: TEST NOT REPORTED DUE TO COLOR INTERFERENCE OF URINE PIGMENT   Bilirubin Urine   (*)    Value: TEST NOT REPORTED DUE TO COLOR INTERFERENCE OF URINE PIGMENT   Ketones, ur   (*)    Value: TEST NOT REPORTED DUE TO COLOR INTERFERENCE OF URINE PIGMENT   Protein, ur   (*)    Value: TEST NOT REPORTED DUE TO COLOR INTERFERENCE OF URINE PIGMENT   Nitrite   (*)    Value: TEST NOT REPORTED DUE TO COLOR INTERFERENCE OF URINE PIGMENT  Leukocytes,Ua   (*)    Value: TEST NOT REPORTED DUE TO COLOR INTERFERENCE OF URINE PIGMENT   RBC / HPF >50 (*)    WBC, UA >50 (*)    Bacteria, UA RARE (*)    All other components within normal limits  URINE CULTURE  CBC    PROCEDURES:  Critical Care performed: No  Procedures   MEDICATIONS ORDERED IN ED: Medications  cephALEXin (KEFLEX) capsule 500 mg (500 mg Oral Given 12/20/21 1806)     IMPRESSION / MDM / ASSESSMENT AND PLAN / ED COURSE  I reviewed the triage vital signs and the nursing notes.                              87 y.o. female with past medical history of hypertension, hyperlipidemia, CAD, and peripheral vascular disease who presents to the ED complaining of suprapubic pressure, dysuria, and hematuria since waking up this morning.  Patient's presentation is most consistent with acute presentation with potential threat to life or  bodily function.  Differential diagnosis includes, but is not limited to, UTI, pyelonephritis, kidney stone, AKI, anemia.  Patient well-appearing and in no acute distress, vital signs are unremarkable.  She has tenderness to palpation in her suprapubic area, symptoms seem most consistent with UTI.  No symptoms to suggest kidney stone or pyelonephritis at this time.  We will check urinalysis as well as CBC and BMP.  If work-up is consistent with UTI and no other complicating factors, patient would be appropriate for discharge home with oral antibiotics.  Urinalysis appears consistent with UTI, hematuria is likely secondary to this.  Urine was sent for culture and patient given initial dose of Keflex.  Remainder of labs are reassuring with no significant anemia, leukocytosis, electrolyte abnormality, or AKI.  Patient is appropriate for outpatient management and was counseled to follow-up with her PCP as well as return to the ED for new or worsening symptoms.  Patient and daughter-in-law agree with plan.      FINAL CLINICAL IMPRESSION(S) / ED DIAGNOSES   Final diagnoses:  Lower urinary tract infectious disease     Rx / DC Orders   ED Discharge Orders          Ordered    cephALEXin (KEFLEX) 500 MG capsule  4 times daily        12/20/21 1858             Note:  This document was prepared using Dragon voice recognition software and may include unintentional dictation errors.   Chesley Noon, MD 12/20/21 1900

## 2021-12-20 NOTE — ED Notes (Signed)
Pt ambulatory to rm at this time. Toileting offered, pt declined. Pt placed on monitor. Call light within reach. Family at bedside. Warm blankets given. Pt has no further needs at this time.

## 2021-12-23 LAB — URINE CULTURE: Culture: 20000 — AB

## 2022-02-24 IMAGING — MR MR LUMBAR SPINE W/O CM
5 series · 30 of 48 positions shown · non-contrast
Comparison: None.

CLINICAL DATA: Right-sided lower back pain radiating down right leg

EXAM:
MRI LUMBAR SPINE WITHOUT CONTRAST
TECHNIQUE: Multiplanar, multisequence MR imaging of the lumbar spine was
performed. No intravenous contrast was administered.

[Series 5: T2 · sagittal · 4.0mm · 0.81mm/px · 6 of 17 slices shown (1 of 2)]
[im 1/17]
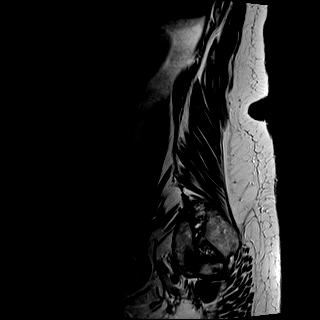
[im 4/17]
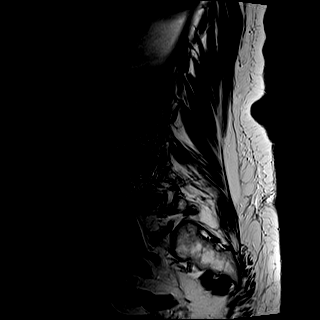
[im 7/17]
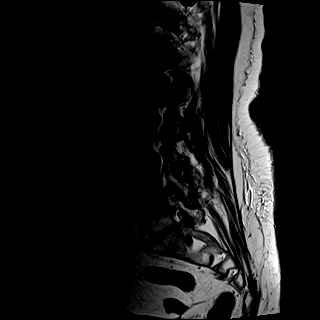
[im 10/17]
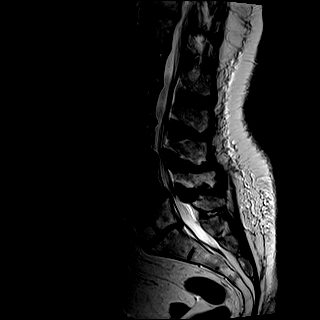
[im 13/17]
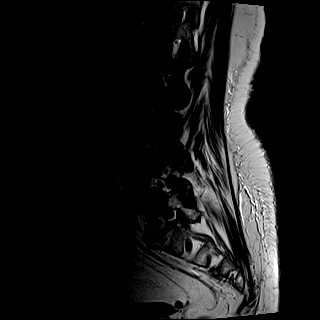
[im 17/17]
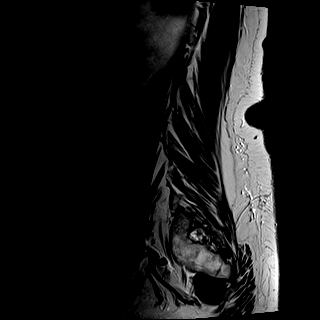

[Series 6: T1 · sagittal · 4.0mm · 0.81mm/px · 7 of 17 slices shown (1 of 2)]
[im 1/17]
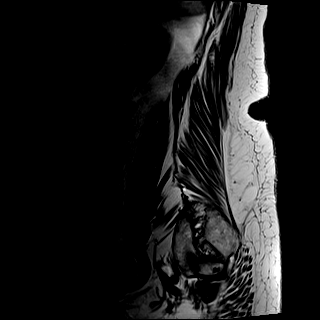
[im 3/17]
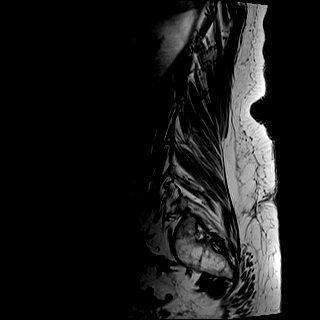
[im 6/17]
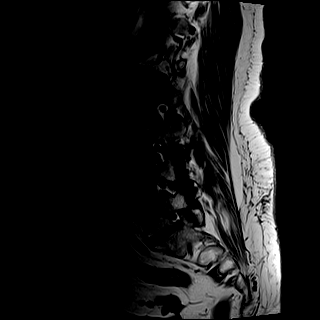
[im 9/17]
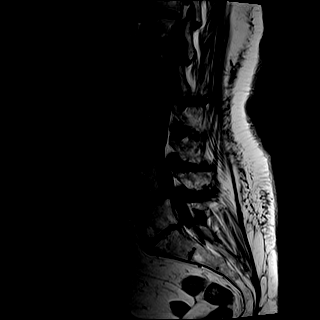
[im 11/17]
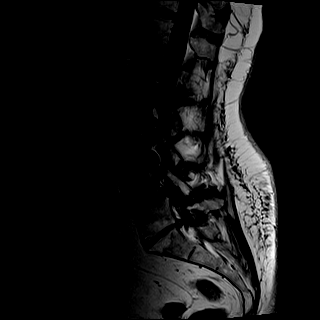
[im 14/17]
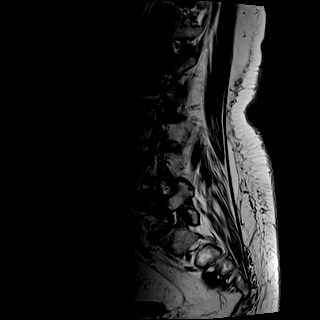
[im 17/17]
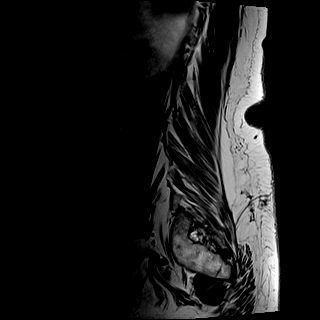

[Series 7: STIR · sagittal · 4.0mm · 0.41mm/px · 1 of 17 slices shown]
[im 1/17]
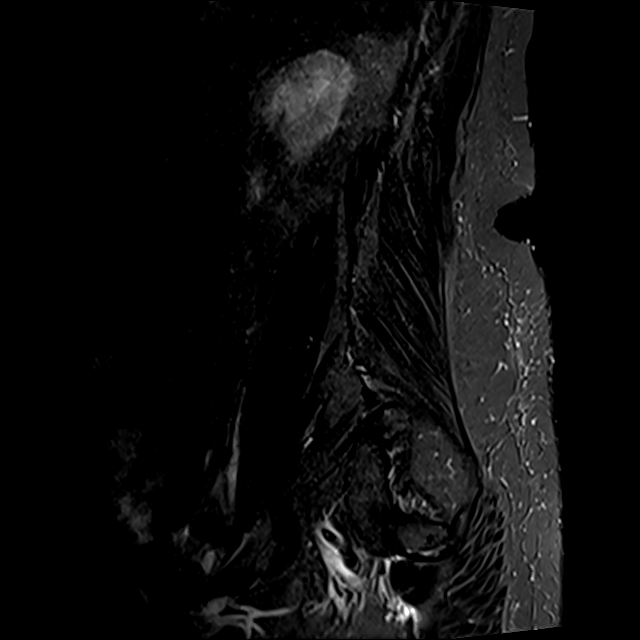

[Series 8: T2 · axial · 4.0mm · 0.78mm/px · z∈[-98,+108]mm · 8 of 34 slices shown (2 of 2)]
[im 1/34]
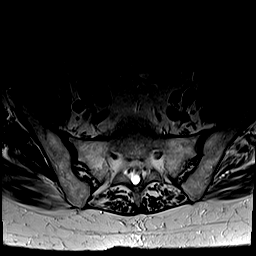
[im 6/34]
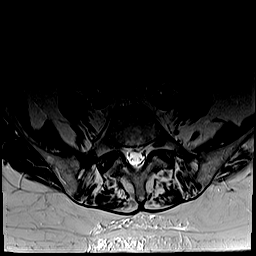
[im 11/34]
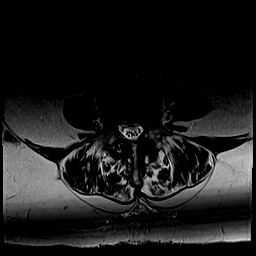
[im 16/34]
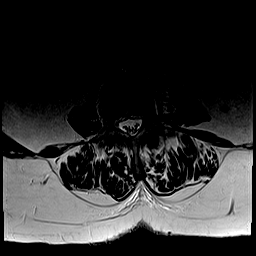
[im 18/34]
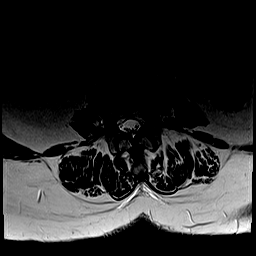
[im 23/34]
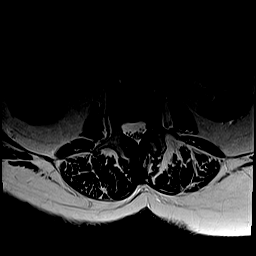
[im 28/34]
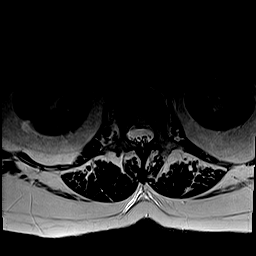
[im 34/34]
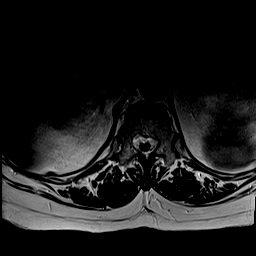

[Series 9: T1 · axial · 4.0mm · 0.39mm/px · z∈[-98,+108]mm · 8 of 34 slices shown (2 of 2)]
[im 1/34]
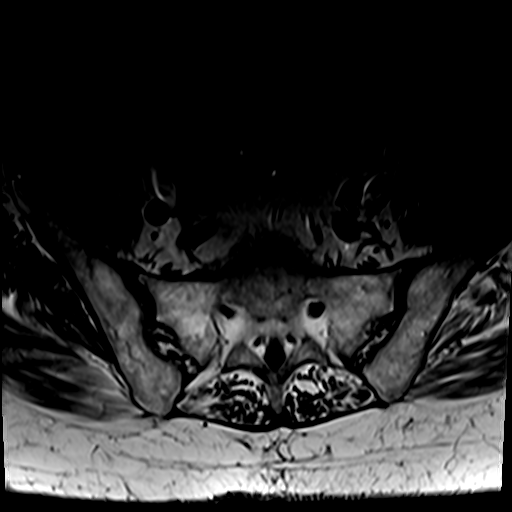
[im 6/34]
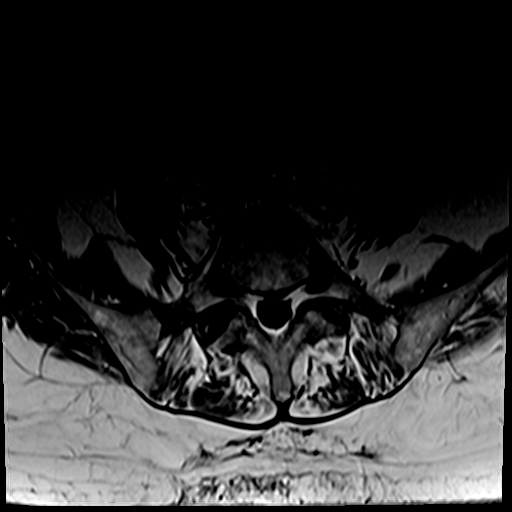
[im 11/34]
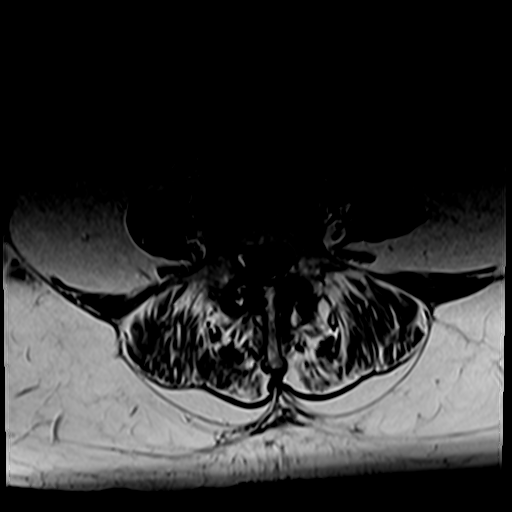
[im 16/34]
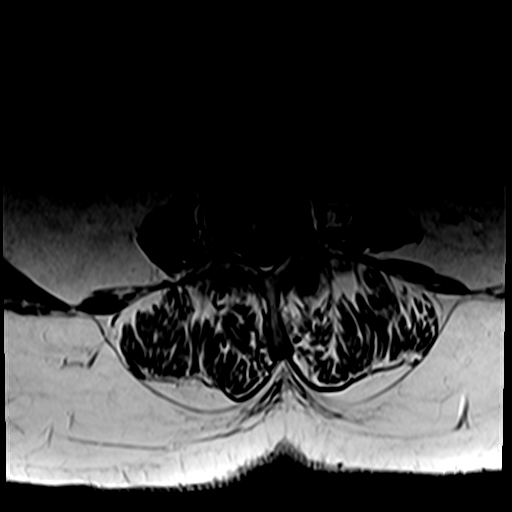
[im 18/34]
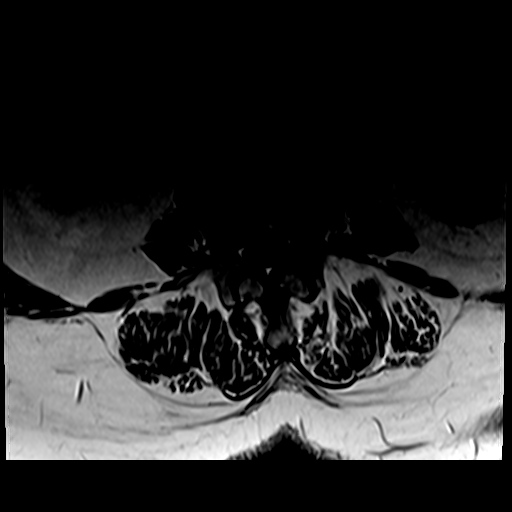
[im 23/34]
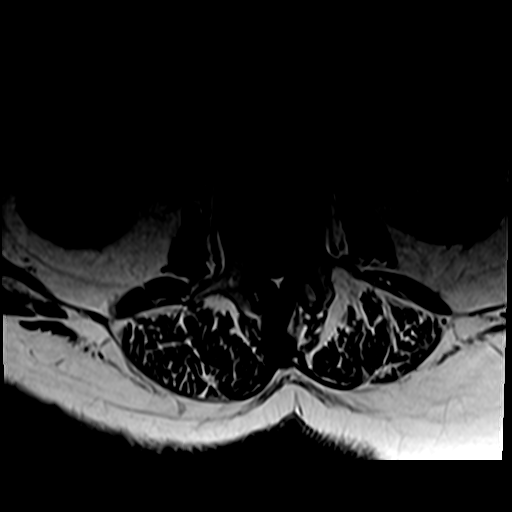
[im 28/34]
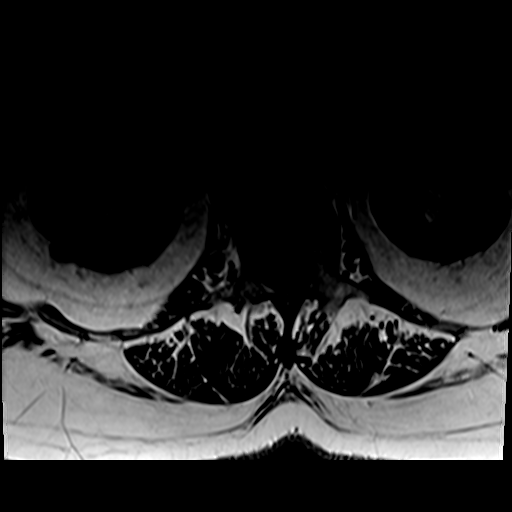
[im 34/34]
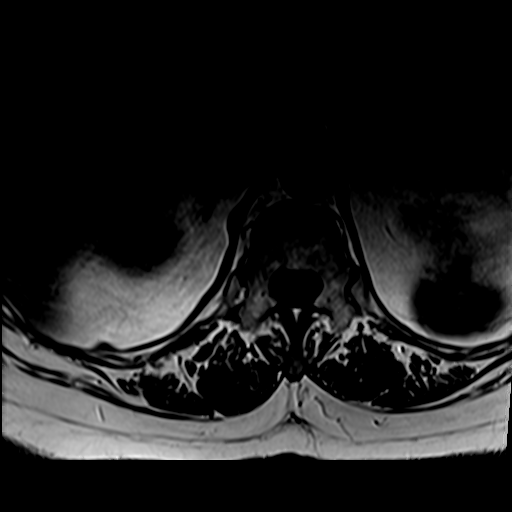

[30 of 48 positions shown; findings below may reference images not displayed]

FINDINGS: Segmentation:  Standard; the lowest disc space is designated L5-S1.

Alignment: There is mild dextrocurvature centered at L3-L4. There is
grade 1 anterolisthesis of L3 on L4 and L4 on L5 and trace grade 1
retrolisthesis of L5 on S1. Sagittal alignment at the remaining
levels is normal.

Vertebrae: Vertebral body heights are preserved. There is mild Modic
type 2 degenerative endplate change at L5-S1. There is no suspicious
marrow signal abnormality.

Conus medullaris and cauda equina: Conus extends to the mid L1
level. Conus and cauda equina appear normal.

Paraspinal and other soft tissues: The paraspinal soft tissues are
unremarkable. Small cortically based T2 hyperintense lesions in the
kidneys are indeterminate but likely reflects cysts.

Disc levels:

There is multilevel disc desiccation and narrowing, most advanced at
L2-L3 and L5-S1. There is multilevel facet arthropathy, most
advanced at L3-L4.

T12-L1: There is a minimal disc bulge and mild bilateral facet
arthropathy without significant spinal canal or neural foraminal
stenosis.

L1-L2: There is a mild diffuse disc bulge and mild bilateral facet
arthropathy resulting in mild spinal canal stenosis with crowding of
the left subarticular zone but no evidence of nerve root impingement
and mild bilateral neural foraminal stenosis.

L2-L3: There is a mild disc bulge, ligamentum flavum thickening, and
left worse than right facet arthropathy resulting in mild spinal
canal stenosis with crowding of the left subarticular zone but no
evidence of nerve root impingement and mild to moderate left and no
significant right neural foraminal stenosis.

L3-L4: There is a diffuse disc bulge with uncovering of the disc
posteriorly, ligamentum flavum thickening, and bilateral facet
arthropathy resulting in moderate spinal canal stenosis with
crowding of the subarticular zones but no evidence of nerve root
impingement, and mild bilateral neural foraminal stenosis.

L4-L5: There is uncovering of the disc posteriorly with a mild
superimposed bulge and superiorly migrated component, ligamentum
flavum thickening, and bilateral facet arthropathy resulting in
severe spinal canal stenosis with impingement of the cauda equina
nerve roots and mild right and no significant left neural foraminal
stenosis.

L5-S1: There is a mild disc bulge and bilateral facet arthropathy
resulting in mild bilateral neural foraminal stenosis without
evidence of spinal canal stenosis.
IMPRESSION: 1. Grade 1 anterolisthesis of L3 on L4 and L4 on L5 and grade 1
retrolisthesis of L5 on S1, likely degenerative in nature.
2. Degenerative changes at L4-L5 detailed above result in severe
spinal canal stenosis with cauda equina nerve root impingement.
3. Degenerative changes at L3-L4 result in moderate spinal canal
stenosis with crowding of the subarticular zones but no definite
evidence of nerve root impingement.
4. Varying degrees of spinal canal and neural foraminal stenosis at
the remaining levels are detailed above.
5. Multilevel facet arthropathy, most advanced at L3-L4.

## 2023-06-07 ENCOUNTER — Other Ambulatory Visit: Payer: Self-pay | Admitting: Neurology

## 2023-06-07 DIAGNOSIS — R4189 Other symptoms and signs involving cognitive functions and awareness: Secondary | ICD-10-CM

## 2023-06-12 ENCOUNTER — Ambulatory Visit
Admission: RE | Admit: 2023-06-12 | Discharge: 2023-06-12 | Disposition: A | Discharge status: Home / Self Care | Payer: Medicare HMO | Source: Ambulatory Visit | Attending: Neurology | Admitting: Neurology

## 2023-06-12 DIAGNOSIS — R4189 Other symptoms and signs involving cognitive functions and awareness: Secondary | ICD-10-CM | POA: Diagnosis present

## 2024-04-01 ENCOUNTER — Emergency Department: Admission: EM | Admit: 2024-04-01 | Discharge: 2024-04-01 | Disposition: A | Discharge status: Home / Self Care

## 2024-04-01 ENCOUNTER — Emergency Department

## 2024-04-01 ENCOUNTER — Other Ambulatory Visit: Payer: Self-pay

## 2024-04-01 DIAGNOSIS — W19XXXA Unspecified fall, initial encounter: Secondary | ICD-10-CM

## 2024-04-01 DIAGNOSIS — Z23 Encounter for immunization: Secondary | ICD-10-CM | POA: Insufficient documentation

## 2024-04-01 DIAGNOSIS — Z7902 Long term (current) use of antithrombotics/antiplatelets: Secondary | ICD-10-CM | POA: Diagnosis not present

## 2024-04-01 DIAGNOSIS — I10 Essential (primary) hypertension: Secondary | ICD-10-CM | POA: Insufficient documentation

## 2024-04-01 DIAGNOSIS — I2510 Atherosclerotic heart disease of native coronary artery without angina pectoris: Secondary | ICD-10-CM | POA: Diagnosis not present

## 2024-04-01 DIAGNOSIS — Z7982 Long term (current) use of aspirin: Secondary | ICD-10-CM | POA: Insufficient documentation

## 2024-04-01 DIAGNOSIS — S0181XA Laceration without foreign body of other part of head, initial encounter: Secondary | ICD-10-CM | POA: Diagnosis not present

## 2024-04-01 DIAGNOSIS — S0990XA Unspecified injury of head, initial encounter: Secondary | ICD-10-CM | POA: Diagnosis present

## 2024-04-01 DIAGNOSIS — W108XXA Fall (on) (from) other stairs and steps, initial encounter: Secondary | ICD-10-CM | POA: Insufficient documentation

## 2024-04-01 MED ORDER — LIDOCAINE-EPINEPHRINE (PF) 2 %-1:200000 IJ SOLN
10.0000 mL | Freq: Once | INTRAMUSCULAR | Status: AC
  Administered 2024-04-01: 10 mL
  Filled 2024-04-01: qty 20

## 2024-04-01 MED ORDER — BACITRACIN ZINC 500 UNIT/GM EX OINT
TOPICAL_OINTMENT | Freq: Once | CUTANEOUS | Status: AC
  Administered 2024-04-01: 1 via TOPICAL
  Filled 2024-04-01: qty 0.9

## 2024-04-01 MED ORDER — TETANUS-DIPHTH-ACELL PERTUSSIS 5-2-15.5 LF-MCG/0.5 IM SUSP
0.5000 mL | Freq: Once | INTRAMUSCULAR | Status: AC
  Administered 2024-04-01: 0.5 mL via INTRAMUSCULAR
  Filled 2024-04-01: qty 0.5

## 2024-04-01 MED ORDER — ACETAMINOPHEN 500 MG PO TABS: 1000.0000 mg | ORAL_TABLET | Freq: Four times a day (QID) | ORAL | 2 refills | Status: AC | PRN

## 2024-04-01 MED ORDER — ACETAMINOPHEN 500 MG PO TABS
1000.0000 mg | ORAL_TABLET | Freq: Once | ORAL | Status: AC
  Administered 2024-04-01: 1000 mg via ORAL
  Filled 2024-04-01: qty 2

## 2024-04-01 NOTE — Discharge Instructions (Addendum)
 Your evaluation in the emergency department was overall reassuring.  You did have a laceration to your forehead which was closed with 9 sutures, and this should be removed in 5-7 days in clinic.  Please do follow-up with your primary care provider regarding this.  You can use Tylenol  as needed for any ongoing discomfort.  Return to the emergency department with any new or worsening symptoms.

## 2024-04-01 NOTE — ED Triage Notes (Signed)
 Pt to ED for fall today, stumped toe and caused fall. Hematoma noted to forehead. Denies LOC. Takes ASA. Reports scratching right knee. Alert and oriented.

## 2024-04-01 NOTE — ED Provider Notes (Signed)
 [facility] Provider Note    Event Date/Time   First MD Initiated Contact with Patient 04/01/24 1523     (approximate)   History   Fall  Pt to ED for fall today, stumped toe and caused fall. Hematoma noted to forehead. Denies LOC. Takes ASA. Reports scratching right knee. Alert and oriented.    HPI Jeanette Malone is a 89 y.o. female PMH CAD, hypertension, hyperlipidemia, PVD presents for evaluation of head trauma after a fall - Patient was climbing stairs and she stubbed her toe on a step fell forward hitting her head.  This occurred around 1230 today.  Loss of consciousness.  Not on blood thinners but does take aspirin  and Plavix . -No other recent falls -No preceding symptoms including dizziness, lightheadedness, chest pain, palpitations, weakness, urinary symptoms -Has otherwise been in her usual state of health -Unsure date of last Tdap     Physical Exam   Triage Vital Signs: ED Triage Vitals  Encounter Vitals Group     BP 04/01/24 1445 (!) 145/84     Girls Systolic BP Percentile --      Girls Diastolic BP Percentile --      Boys Systolic BP Percentile --      Boys Diastolic BP Percentile --      Pulse Rate 04/01/24 1445 84     Resp 04/01/24 1445 16     Temp 04/01/24 1445 97.8 F (36.6 C)     Temp src --      SpO2 04/01/24 1445 97 %     Weight 04/01/24 1446 140 lb (63.5 kg)     Height 04/01/24 1446 4' 11 (1.499 m)     Head Circumference --      Peak Flow --      Pain Score 04/01/24 1445 3     Pain Loc --      Pain Education --      Exclude from Growth Chart --     Most recent vital signs: Vitals:   04/01/24 1445  BP: (!) 145/84  Pulse: 84  Resp: 16  Temp: 97.8 F (36.6 C)  SpO2: 97%     General: Awake, no distress.  HEENT: 6 cm stellate full-thickness laceration to the forehead, no underlying bone or galea exposed, hemostatic, no foreign bodies appreciated, wound explored through full range of motion.  No midline neck  pain, full neck range of motion. CV:  Good peripheral perfusion. RRR, RP 2+ Resp:  Normal effort. CTAB Abd:  No distention. Nontender to deep palpation throughout Back:  No midline tenderness Other:  Pelvis stable.  Full range of motion of all joints in bilateral upper and lower extremities with no tenderness to palpation throughout.   ED Results / Procedures / Treatments   Labs (all labs ordered are listed, but only abnormal results are displayed) Labs Reviewed - No data to display   EKG  N/a   RADIOLOGY Radiology interpreted by myself radiology reports reviewed.  No acute pathology identified.    PROCEDURES:  Critical Care performed: No  .Laceration Repair  Date/Time: 04/01/2024 4:26 PM  Performed by: Clarine Ozell LABOR, MD Authorized by: Clarine Ozell LABOR, MD   Consent:    Consent obtained:  Verbal   Consent given by:  Patient   Risks, benefits, and alternatives were discussed: yes     Risks discussed:  Infection, nerve damage, pain, vascular damage, tendon damage, retained foreign body, poor cosmetic result, need for additional repair and  poor wound healing   Alternatives discussed:  No treatment and delayed treatment Universal protocol:    Procedure explained and questions answered to patient or proxy's satisfaction: yes     Relevant documents present and verified: yes     Imaging studies available: yes     Required blood products, implants, devices, and special equipment available: yes     Site/side marked: yes     Immediately prior to procedure, a time out was called: yes     Patient identity confirmed:  Verbally with patient and arm band Anesthesia:    Anesthesia method:  Local infiltration   Local anesthetic:  Lidocaine  1% WITH epi Laceration details:    Location:  Face   Face location:  Forehead   Length (cm):  6 Pre-procedure details:    Preparation:  Patient was prepped and draped in usual sterile fashion and imaging obtained to evaluate for foreign  bodies Exploration:    Limited defect created (wound extended): no     Hemostasis achieved with:  Direct pressure   Imaging obtained comment:  CT head   Imaging outcome: foreign body not noted     Wound exploration: wound explored through full range of motion and entire depth of wound visualized     Wound extent: no signs of injury, no underlying fracture and no vascular damage     Contaminated: no   Treatment:    Area cleansed with:  Saline   Amount of cleaning:  Extensive   Irrigation solution:  Sterile saline   Irrigation method:  Pressure wash   Visualized foreign bodies/material removed: no   Skin repair:    Repair method:  Sutures   Suture size:  6-0 (vicryl (absorbable))   Suture technique:  Simple interrupted   Number of sutures:  9 Approximation:    Approximation:  Close Repair type:    Repair type:  Simple Post-procedure details:    Dressing:  Antibiotic ointment and non-adherent dressing   Procedure completion:  Tolerated well, no immediate complications    MEDICATIONS ORDERED IN ED: Medications  bacitracin  ointment (has no administration in time range)  acetaminophen  (TYLENOL ) tablet 1,000 mg (1,000 mg Oral Given 04/01/24 1552)  lidocaine -EPINEPHrine  (XYLOCAINE  W/EPI) 2 %-1:200000 (PF) injection 10 mL (10 mLs Infiltration Given 04/01/24 1554)  Tdap (ADACEL ) injection 0.5 mL (0.5 mLs Intramuscular Given 04/01/24 1554)     IMPRESSION / MDM / ASSESSMENT AND PLAN / ED COURSE  I reviewed the triage vital signs and the nursing notes.                              DDX/MDM/AP: Differential diagnosis includes, but is not limited to, apparent mechanical fall with obvious laceration to forehead, consider underlying skull fracture, intracranial hemorrhage, C-spine fracture.  No evidence of other traumatic injuries at this time.  Doubt underlying organic contributor to fall including electrolyte abnormality, anemia, UTI, arrhythmia given negative associated review of  symptoms.  Plan: - CT head, CT C-spine - Tylenol , update Tdap - Offered basic screening labs and EKG which patient declined - Plan for lac repair  Patient's presentation is most consistent with acute presentation with potential threat to life or bodily function.  The patient is on the cardiac monitor to evaluate for evidence of arrhythmia and/or significant heart rate changes.  ED course below.  Imaging negative.  Tdap updated.  Laceration irrigated extensively, repaired with 9 sutures of 6-0 Vicryl (absorbable).  Plan for  wound reevaluation and suture removal in 5-7 days.  ED return precautions were placed.  Patient and family agree with plan.  Rx Tylenol  for use as needed.  Clinical Course as of 04/01/24 1629  Tue Apr 01, 2024  1525 CTH: IMPRESSION: 1. No acute intracranial abnormality. 2. Forehead laceration and hematoma. 3. Moderate chronic small vessel ischemic disease.   [MM]  1526 CT Cspine: IMPRESSION: 1. No evidence of acute cervical spine fracture, traumatic subluxation or static signs of instability. 2. Relatively mild multilevel spondylosis for age.   [MM]    Clinical Course User Index [MM] Clarine Ozell LABOR, MD     FINAL CLINICAL IMPRESSION(S) / ED DIAGNOSES   Final diagnoses:  Fall, initial encounter  Facial laceration, initial encounter     Rx / DC Orders   ED Discharge Orders          Ordered    acetaminophen  (TYLENOL ) 500 MG tablet  Every 6 hours PRN        04/01/24 1557             Note:  This document was prepared using Dragon voice recognition software and may include unintentional dictation errors.   Clarine Ozell LABOR, MD 04/01/24 [phone]
